# Patient Record
Sex: Male | Born: 1950 | Race: Black or African American | Hispanic: No | Marital: Married | Smoking: Current every day smoker
Health system: Southern US, Community
[De-identification: ages and names within clinical notes are randomized; demographics above are authoritative.]

## PROBLEM LIST (undated history)

## (undated) DIAGNOSIS — I10 Essential (primary) hypertension: Secondary | ICD-10-CM

## (undated) DIAGNOSIS — H409 Unspecified glaucoma: Secondary | ICD-10-CM

## (undated) HISTORY — DX: Essential (primary) hypertension: I10

---

## 1999-04-14 ENCOUNTER — Encounter: Payer: Self-pay | Admitting: Emergency Medicine

## 1999-04-14 ENCOUNTER — Emergency Department (HOSPITAL_COMMUNITY): Admission: EM | Admit: 1999-04-14 | Discharge: 1999-04-14 | Payer: Self-pay | Admitting: Emergency Medicine

## 2002-07-28 ENCOUNTER — Emergency Department (HOSPITAL_COMMUNITY): Admission: EM | Admit: 2002-07-28 | Discharge: 2002-07-28 | Payer: Self-pay | Admitting: Emergency Medicine

## 2003-10-21 ENCOUNTER — Emergency Department (HOSPITAL_COMMUNITY): Admission: EM | Admit: 2003-10-21 | Discharge: 2003-10-21 | Payer: Self-pay | Admitting: Emergency Medicine

## 2011-01-24 ENCOUNTER — Emergency Department (HOSPITAL_COMMUNITY)
Admission: EM | Admit: 2011-01-24 | Discharge: 2011-01-24 | Disposition: A | Discharge status: Home / Self Care | Payer: BC Managed Care – PPO | Attending: Emergency Medicine | Admitting: Emergency Medicine

## 2011-01-24 ENCOUNTER — Encounter (HOSPITAL_COMMUNITY): Payer: Self-pay | Admitting: *Deleted

## 2011-01-24 DIAGNOSIS — S46919A Strain of unspecified muscle, fascia and tendon at shoulder and upper arm level, unspecified arm, initial encounter: Secondary | ICD-10-CM

## 2011-01-24 DIAGNOSIS — F17200 Nicotine dependence, unspecified, uncomplicated: Secondary | ICD-10-CM | POA: Insufficient documentation

## 2011-01-24 DIAGNOSIS — M545 Low back pain, unspecified: Secondary | ICD-10-CM | POA: Insufficient documentation

## 2011-01-24 DIAGNOSIS — M25519 Pain in unspecified shoulder: Secondary | ICD-10-CM | POA: Insufficient documentation

## 2011-01-24 DIAGNOSIS — IMO0002 Reserved for concepts with insufficient information to code with codable children: Secondary | ICD-10-CM | POA: Insufficient documentation

## 2011-01-24 DIAGNOSIS — S335XXA Sprain of ligaments of lumbar spine, initial encounter: Secondary | ICD-10-CM | POA: Insufficient documentation

## 2011-01-24 DIAGNOSIS — S39012A Strain of muscle, fascia and tendon of lower back, initial encounter: Secondary | ICD-10-CM

## 2011-01-24 MED ORDER — CYCLOBENZAPRINE HCL 10 MG PO TABS: 10.0000 mg | ORAL_TABLET | Freq: Two times a day (BID) | ORAL | Status: AC | PRN

## 2011-01-24 MED ORDER — HYDROCODONE-ACETAMINOPHEN 5-500 MG PO TABS: 1.0000 | ORAL_TABLET | Freq: Four times a day (QID) | ORAL | Status: AC | PRN
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
## 2013-08-31 ENCOUNTER — Inpatient Hospital Stay (HOSPITAL_COMMUNITY)
Admission: EM | Admit: 2013-08-31 | Discharge: 2013-09-02 | DRG: 238 | Disposition: A | Discharge status: Home / Self Care | Payer: BC Managed Care – PPO | Attending: Surgery | Admitting: Surgery

## 2013-08-31 ENCOUNTER — Encounter (HOSPITAL_COMMUNITY): Payer: Self-pay | Admitting: Emergency Medicine

## 2013-08-31 DIAGNOSIS — I10 Essential (primary) hypertension: Secondary | ICD-10-CM | POA: Diagnosis present

## 2013-08-31 DIAGNOSIS — F17200 Nicotine dependence, unspecified, uncomplicated: Secondary | ICD-10-CM | POA: Diagnosis present

## 2013-08-31 DIAGNOSIS — I498 Other specified cardiac arrhythmias: Secondary | ICD-10-CM | POA: Diagnosis present

## 2013-08-31 DIAGNOSIS — R109 Unspecified abdominal pain: Secondary | ICD-10-CM | POA: Diagnosis present

## 2013-08-31 DIAGNOSIS — I714 Abdominal aortic aneurysm, without rupture, unspecified: Secondary | ICD-10-CM | POA: Diagnosis not present

## 2013-08-31 DIAGNOSIS — H409 Unspecified glaucoma: Secondary | ICD-10-CM | POA: Diagnosis present

## 2013-08-31 DIAGNOSIS — I739 Peripheral vascular disease, unspecified: Secondary | ICD-10-CM | POA: Diagnosis present

## 2013-08-31 HISTORY — DX: Unspecified glaucoma: H40.9

## 2013-08-31 LAB — COMPREHENSIVE METABOLIC PANEL
ALK PHOS: 65 U/L (ref 39–117)
ALT: 18 U/L (ref 0–53)
AST: 16 U/L (ref 0–37)
Albumin: 4.1 g/dL (ref 3.5–5.2)
Anion gap: 14 (ref 5–15)
BUN: 17 mg/dL (ref 6–23)
CHLORIDE: 102 meq/L (ref 96–112)
CO2: 25 mEq/L (ref 19–32)
Calcium: 10.5 mg/dL (ref 8.4–10.5)
Creatinine, Ser: 0.78 mg/dL (ref 0.50–1.35)
GFR calc non Af Amer: >90 mL/min (ref >90)
GLUCOSE: 109 mg/dL — AB (ref 70–99)
Potassium: 3.8 mEq/L (ref 3.7–5.3)
Sodium: 141 mEq/L (ref 137–147)
TOTAL PROTEIN: 8 g/dL (ref 6.0–8.3)
Total Bilirubin: 0.3 mg/dL (ref 0.3–1.2)

## 2013-08-31 LAB — CBC WITH DIFFERENTIAL/PLATELET
Basophils Absolute: 0 10*3/uL (ref 0.0–0.1)
Basophils Relative: 0 % (ref 0–1)
EOS ABS: 0 10*3/uL (ref 0.0–0.7)
Eosinophils Relative: 0 % (ref 0–5)
HCT: 42.8 % (ref 39.0–52.0)
HEMOGLOBIN: 14.7 g/dL (ref 13.0–17.0)
LYMPHS PCT: 14 % (ref 12–46)
Lymphs Abs: 1.3 10*3/uL (ref 0.7–4.0)
MCH: 30.4 pg (ref 26.0–34.0)
MCHC: 34.3 g/dL (ref 30.0–36.0)
MCV: 88.4 fL (ref 78.0–100.0)
MONOS PCT: 5 % (ref 3–12)
Monocytes Absolute: 0.5 10*3/uL (ref 0.1–1.0)
NEUTROS PCT: 81 % — AB (ref 43–77)
Neutro Abs: 7.4 10*3/uL (ref 1.7–7.7)
Platelets: 192 10*3/uL (ref 150–400)
RBC: 4.84 MIL/uL (ref 4.22–5.81)
RDW: 15.5 % (ref 11.5–15.5)
WBC: 9.2 10*3/uL (ref 4.0–10.5)

## 2013-08-31 LAB — I-STAT CHEM 8, ED
BUN: 18 mg/dL (ref 6–23)
CALCIUM ION: 1.23 mmol/L (ref 1.13–1.30)
CHLORIDE: 106 meq/L (ref 96–112)
Creatinine, Ser: 0.8 mg/dL (ref 0.50–1.35)
GLUCOSE: 115 mg/dL — AB (ref 70–99)
HEMATOCRIT: 48 % (ref 39.0–52.0)
Hemoglobin: 16.3 g/dL (ref 13.0–17.0)
POTASSIUM: 3.7 meq/L (ref 3.7–5.3)
Sodium: 140 mEq/L (ref 137–147)
TCO2: 27 mmol/L (ref 0–100)

## 2013-08-31 LAB — I-STAT TROPONIN, ED: Troponin i, poc: 0 ng/mL (ref 0.00–0.08)

## 2013-08-31 LAB — LIPASE, BLOOD: Lipase: 21 U/L (ref 11–59)

## 2013-08-31 MED ORDER — IOHEXOL 300 MG/ML  SOLN
25.0000 mL | Freq: Once | INTRAMUSCULAR | Status: AC | PRN
  Administered 2013-08-31: 25 mL via ORAL

## 2013-08-31 MED ORDER — MORPHINE SULFATE 4 MG/ML IJ SOLN
4.0000 mg | Freq: Once | INTRAMUSCULAR | Status: AC
  Administered 2013-08-31: 4 mg via INTRAVENOUS
  Filled 2013-08-31: qty 1

## 2013-08-31 MED ORDER — ONDANSETRON HCL 4 MG/2ML IJ SOLN
4.0000 mg | Freq: Once | INTRAMUSCULAR | Status: AC
  Administered 2013-08-31: 4 mg via INTRAVENOUS
  Filled 2013-08-31: qty 2

## 2013-08-31 MED ORDER — SODIUM CHLORIDE 0.9 % IV SOLN
1.0000 g | Freq: Once | INTRAVENOUS | Status: AC
  Administered 2013-08-31: 1 g via INTRAVENOUS
  Filled 2013-08-31: qty 10

## 2013-08-31 NOTE — ED Notes (Signed)
Pt placed into gown and on monitor upon arrival to room. Pt monitored by blood pressure, pulse ox, and 12 lead. Pts wife at bedside.

## 2013-08-31 NOTE — ED Provider Notes (Signed)
CSN: [id number]     Arrival date & time 08/31/13  2118 History   First MD Initiated Contact with Patient 08/31/13 2150     Chief Complaint  Patient presents with  . Abdominal Pain  . Bradycardia     (Consider location/radiation/quality/duration/timing/severity/associated sxs/prior Treatment) Patient is a 63 y.o. male presenting with abdominal pain. The history is provided by the patient and the spouse.  Abdominal Pain Pain location:  LLQ Pain quality: cramping, sharp and shooting   Pain radiates to:  Does not radiate Pain severity:  Moderate Onset quality:  Sudden Duration:  1 day Timing:  Constant Progression:  Unchanged Chronicity:  New Relieved by:  Nothing Worsened by:  Nothing tried Ineffective treatments:  None tried Associated symptoms: no chest pain, no chills, no diarrhea, no fever, no shortness of breath and no vomiting   Risk factors: being elderly    63 yo M with a chief complaint of left lower quadrant pain. This started this morning. Pain is crampy nonradiating. Patient states that it comes and goes. Patient has never had this pain before. Patient denies fevers chills. Patient denies any chest pain shortness breath.  Past Medical History  Diagnosis Date  . Glaucoma    History reviewed. No pertinent past surgical history. History reviewed. No pertinent family history. History  Substance Use Topics  . Smoking status: Current Every Day Smoker -- 0.50 packs/day    Types: Cigarettes  . Smokeless tobacco: Not on file  . Alcohol Use: Yes     Comment: drinks 3-4 drinks of liquor per day    Review of Systems  Constitutional: Negative for fever and chills.  HENT: Negative for congestion and facial swelling.   Eyes: Negative for discharge and visual disturbance.  Respiratory: Negative for shortness of breath.   Cardiovascular: Negative for chest pain and palpitations.  Gastrointestinal: Positive for abdominal pain. Negative for vomiting and diarrhea.   Musculoskeletal: Negative for arthralgias and myalgias.  Skin: Negative for color change and rash.  Neurological: Negative for tremors, syncope and headaches.  Psychiatric/Behavioral: Negative for confusion and dysphoric mood.      Allergies  Review of patient's allergies indicates no known allergies.  Home Medications   Prior to Admission medications   Medication Sig Start Date End Date Taking? Authorizing Provider  acetaminophen (TYLENOL) 500 MG tablet Take 500 mg by mouth daily as needed for mild pain.    Yes Historical Provider, MD  latanoprost (XALATAN) 0.005 % ophthalmic solution Place 1 drop into both eyes at bedtime.    Yes Historical Provider, MD  sodium-potassium bicarbonate (ALKA-SELTZER GOLD) TBEF dissolvable tablet Take 2 tablets by mouth daily as needed (for indigestion).   Yes Historical Provider, MD   BP 145/74  Pulse 46  Temp(Src) 98.3 F (36.8 C) (Oral)  Resp 26  Ht 6\' 3"  (1.905 m)  Wt 161 lb (73.029 kg)  BMI 20.12 kg/m2  SpO2 97% Physical Exam  Constitutional: He is oriented to person, place, and time. He appears well-developed and well-nourished.  HENT:  Head: Normocephalic and atraumatic.  Eyes: EOM are normal. Pupils are equal, round, and reactive to light.  Neck: Normal range of motion. Neck supple. No JVD present.  Cardiovascular: Normal rate and regular rhythm.  Exam reveals no gallop and no friction rub.   No murmur heard. Pulmonary/Chest: No respiratory distress. He has no wheezes.  Abdominal: He exhibits no distension. There is tenderness (mild TTP in the LLQ). There is no rebound and no guarding.  Musculoskeletal: Normal  range of motion.  Neurological: He is alert and oriented to person, place, and time.  Skin: No rash noted. No pallor.  Psychiatric: He has a normal mood and affect. His behavior is normal.    ED Course  Procedures (including critical care time) Labs Review Labs Reviewed  CBC WITH DIFFERENTIAL - Abnormal; Notable for the  following:    Neutrophils Relative % 81 (*)    All other components within normal limits  COMPREHENSIVE METABOLIC PANEL - Abnormal; Notable for the following:    Glucose, Bld 109 (*)    All other components within normal limits  I-STAT CHEM 8, ED - Abnormal; Notable for the following:    Glucose, Bld 115 (*)    All other components within normal limits  LIPASE, BLOOD  URINALYSIS, ROUTINE W REFLEX MICROSCOPIC  I-STAT TROPOININ, ED    Imaging Review No results found.   EKG Interpretation   Date/Time:  Wednesday August 31 2013 21:50:27 EDT Ventricular Rate:  42 PR Interval:  142 QRS Duration: 84 QT Interval:  478 QTC Calculation: 399 R Axis:   79 Text Interpretation:  Marked sinus bradycardia Left ventricular  hypertrophy Cannot rule out Septal infarct , age undetermined Abnormal ECG  Peaked t waves No old tracing to compare Confirmed by KNAPP  MD-J, JON  ([id number]) on 08/31/2013 9:56:05 PM      MDM   Final diagnoses:  Abdominal pain, unspecified abdominal location    63 yo M with a chief complaint of left lower quadrant pain. Patient's initial EKG concerning for hyperkalemia versus right heart pathology as patient was in sinus bradycardia with a rate of 42. I-STAT chem 8 negative for hyperkalemia. I-STAT troponin negative.  Patient care turned over to Dr. Lita Mains, awaiting CT abdomen/pelvis.    Deno Etienne, MD 09/01/13 0020

## 2013-08-31 NOTE — ED Notes (Signed)
Pt continues to be monitored by blood pressure, pulse ox, and 12 lead. Family remains at bedside. Provided pt with urinal and asked pt to provide urine specimen.

## 2013-08-31 NOTE — ED Notes (Signed)
Pt reports lower abd pain starting this morning, a/w n/v

## 2013-09-01 ENCOUNTER — Encounter (HOSPITAL_COMMUNITY): Payer: Self-pay

## 2013-09-01 ENCOUNTER — Emergency Department (HOSPITAL_COMMUNITY): Payer: BC Managed Care – PPO

## 2013-09-01 ENCOUNTER — Inpatient Hospital Stay (HOSPITAL_COMMUNITY): Payer: BC Managed Care – PPO

## 2013-09-01 ENCOUNTER — Encounter (HOSPITAL_COMMUNITY)
Admission: EM | Disposition: A | Discharge status: Home / Self Care | Payer: Self-pay | Source: Home / Self Care | Attending: Surgery

## 2013-09-01 ENCOUNTER — Inpatient Hospital Stay (HOSPITAL_COMMUNITY): Payer: BC Managed Care – PPO | Admitting: Certified Registered Nurse Anesthetist

## 2013-09-01 ENCOUNTER — Encounter (HOSPITAL_COMMUNITY): Payer: BC Managed Care – PPO | Admitting: Certified Registered Nurse Anesthetist

## 2013-09-01 DIAGNOSIS — I498 Other specified cardiac arrhythmias: Secondary | ICD-10-CM | POA: Diagnosis present

## 2013-09-01 DIAGNOSIS — I714 Abdominal aortic aneurysm, without rupture, unspecified: Secondary | ICD-10-CM | POA: Diagnosis present

## 2013-09-01 DIAGNOSIS — R109 Unspecified abdominal pain: Secondary | ICD-10-CM | POA: Diagnosis present

## 2013-09-01 DIAGNOSIS — I10 Essential (primary) hypertension: Secondary | ICD-10-CM | POA: Diagnosis present

## 2013-09-01 DIAGNOSIS — F17200 Nicotine dependence, unspecified, uncomplicated: Secondary | ICD-10-CM | POA: Diagnosis present

## 2013-09-01 DIAGNOSIS — I739 Peripheral vascular disease, unspecified: Secondary | ICD-10-CM | POA: Diagnosis present

## 2013-09-01 DIAGNOSIS — H409 Unspecified glaucoma: Secondary | ICD-10-CM | POA: Diagnosis present

## 2013-09-01 HISTORY — PX: ABDOMINAL AORTIC ENDOVASCULAR STENT GRAFT: SHX5707

## 2013-09-01 LAB — URINALYSIS, ROUTINE W REFLEX MICROSCOPIC
BILIRUBIN URINE: NEGATIVE
GLUCOSE, UA: NEGATIVE mg/dL
KETONES UR: 40 mg/dL — AB
Leukocytes, UA: NEGATIVE
NITRITE: NEGATIVE
PH: 7 (ref 5.0–8.0)
Protein, ur: NEGATIVE mg/dL
SPECIFIC GRAVITY, URINE: 1.01 (ref 1.005–1.030)
Urobilinogen, UA: 1 mg/dL (ref 0.0–1.0)

## 2013-09-01 LAB — URINE MICROSCOPIC-ADD ON

## 2013-09-01 LAB — SURGICAL PCR SCREEN
MRSA, PCR: NEGATIVE
STAPHYLOCOCCUS AUREUS: NEGATIVE

## 2013-09-01 LAB — ABO/RH: ABO/RH(D): A POS

## 2013-09-01 LAB — PREPARE RBC (CROSSMATCH)

## 2013-09-01 SURGERY — INSERTION, ENDOVASCULAR STENT GRAFT, AORTA, ABDOMINAL
Anesthesia: General

## 2013-09-01 MED ORDER — DEXTROSE 5 % IV SOLN
1.5000 g | INTRAVENOUS | Status: AC
  Administered 2013-09-01: 1.5 g via INTRAVENOUS
  Filled 2013-09-01: qty 1.5

## 2013-09-01 MED ORDER — MORPHINE SULFATE 4 MG/ML IJ SOLN
INTRAMUSCULAR | Status: AC
  Filled 2013-09-01: qty 1

## 2013-09-01 MED ORDER — LABETALOL HCL 5 MG/ML IV SOLN: 10.0000 mg | INTRAVENOUS | Status: DC | PRN

## 2013-09-01 MED ORDER — LACTATED RINGERS IV SOLN
INTRAVENOUS | Status: DC | PRN
  Administered 2013-09-01: 12:00:00 via INTRAVENOUS

## 2013-09-01 MED ORDER — DOCUSATE SODIUM 100 MG PO CAPS: 100.0000 mg | ORAL_CAPSULE | Freq: Every day | ORAL | Status: DC

## 2013-09-01 MED ORDER — ONDANSETRON HCL 4 MG/2ML IJ SOLN: 4.0000 mg | Freq: Four times a day (QID) | INTRAMUSCULAR | Status: DC | PRN

## 2013-09-01 MED ORDER — DOPAMINE-DEXTROSE 3.2-5 MG/ML-% IV SOLN: 3.0000 ug/kg/min | INTRAVENOUS | Status: DC

## 2013-09-01 MED ORDER — DEXTROSE 5 % IV SOLN
1.5000 g | Freq: Two times a day (BID) | INTRAVENOUS | Status: AC
  Administered 2013-09-01 – 2013-09-02 (×2): 1.5 g via INTRAVENOUS
  Filled 2013-09-01 (×2): qty 1.5

## 2013-09-01 MED ORDER — LATANOPROST 0.005 % OP SOLN
1.0000 [drp] | Freq: Every day | OPHTHALMIC | Status: DC
  Administered 2013-09-01: 1 [drp] via OPHTHALMIC
  Filled 2013-09-01: qty 2.5

## 2013-09-01 MED ORDER — HEPARIN SODIUM (PORCINE) 1000 UNIT/ML IJ SOLN
INTRAMUSCULAR | Status: DC | PRN
  Administered 2013-09-01: 7000 [IU] via INTRAVENOUS

## 2013-09-01 MED ORDER — ACETAMINOPHEN 325 MG PO TABS: 325.0000 mg | ORAL_TABLET | ORAL | Status: DC | PRN

## 2013-09-01 MED ORDER — GUAIFENESIN-DM 100-10 MG/5ML PO SYRP: 15.0000 mL | ORAL_SOLUTION | ORAL | Status: DC | PRN

## 2013-09-01 MED ORDER — SODIUM CHLORIDE 0.9 % IV SOLN: 500.0000 mL | Freq: Once | INTRAVENOUS | Status: AC | PRN

## 2013-09-01 MED ORDER — PHENYLEPHRINE HCL 10 MG/ML IJ SOLN
10.0000 mg | INTRAMUSCULAR | Status: DC | PRN
  Administered 2013-09-01: 15 ug/min via INTRAVENOUS

## 2013-09-01 MED ORDER — BISACODYL 10 MG RE SUPP: 10.0000 mg | Freq: Every day | RECTAL | Status: DC | PRN

## 2013-09-01 MED ORDER — ACETAMINOPHEN 650 MG RE SUPP: 325.0000 mg | RECTAL | Status: DC | PRN

## 2013-09-01 MED ORDER — FENTANYL CITRATE 0.05 MG/ML IJ SOLN
INTRAMUSCULAR | Status: DC | PRN
Start: 2013-09-01 — End: 2013-09-01
  Administered 2013-09-01: 100 ug via INTRAVENOUS

## 2013-09-01 MED ORDER — LACTATED RINGERS IV SOLN
INTRAVENOUS | Status: DC
  Administered 2013-09-01: 11:00:00 via INTRAVENOUS

## 2013-09-01 MED ORDER — SENNOSIDES-DOCUSATE SODIUM 8.6-50 MG PO TABS
1.0000 | ORAL_TABLET | Freq: Every evening | ORAL | Status: DC | PRN
  Filled 2013-09-01: qty 1

## 2013-09-01 MED ORDER — FENTANYL CITRATE 0.05 MG/ML IJ SOLN
INTRAMUSCULAR | Status: AC
  Filled 2013-09-01: qty 5

## 2013-09-01 MED ORDER — ONDANSETRON HCL 4 MG/2ML IJ SOLN: 4.0000 mg | Freq: Once | INTRAMUSCULAR | Status: DC | PRN

## 2013-09-01 MED ORDER — METOPROLOL TARTRATE 1 MG/ML IV SOLN: 2.0000 mg | INTRAVENOUS | Status: DC | PRN

## 2013-09-01 MED ORDER — ALUM & MAG HYDROXIDE-SIMETH 200-200-20 MG/5ML PO SUSP: 15.0000 mL | ORAL | Status: DC | PRN

## 2013-09-01 MED ORDER — SODIUM CHLORIDE 0.9 % IV SOLN: Freq: Once | INTRAVENOUS | Status: DC

## 2013-09-01 MED ORDER — LABETALOL HCL 5 MG/ML IV SOLN
10.0000 mg | INTRAVENOUS | Status: DC | PRN
Start: 2013-09-01 — End: 2013-09-01

## 2013-09-01 MED ORDER — KCL IN DEXTROSE-NACL 20-5-0.45 MEQ/L-%-% IV SOLN
INTRAVENOUS | Status: DC
  Administered 2013-09-01: 75 mL/h via INTRAVENOUS
  Filled 2013-09-01 (×2): qty 1000

## 2013-09-01 MED ORDER — MIDAZOLAM HCL 2 MG/2ML IJ SOLN
INTRAMUSCULAR | Status: AC
  Filled 2013-09-01: qty 2

## 2013-09-01 MED ORDER — POTASSIUM CHLORIDE CRYS ER 20 MEQ PO TBCR: 20.0000 meq | EXTENDED_RELEASE_TABLET | Freq: Every day | ORAL | Status: DC | PRN

## 2013-09-01 MED ORDER — NEOSTIGMINE METHYLSULFATE 10 MG/10ML IV SOLN
INTRAVENOUS | Status: DC | PRN
  Administered 2013-09-01: 4 mg via INTRAVENOUS

## 2013-09-01 MED ORDER — MAGNESIUM SULFATE 40 MG/ML IJ SOLN: 2.0000 g | Freq: Every day | INTRAMUSCULAR | Status: DC | PRN

## 2013-09-01 MED ORDER — SODIUM CHLORIDE 0.9 % IV SOLN
INTRAVENOUS | Status: DC
  Administered 2013-09-01: 15:00:00 via INTRAVENOUS

## 2013-09-01 MED ORDER — PHENOL 1.4 % MT LIQD
1.0000 | OROMUCOSAL | Status: DC | PRN
Start: 2013-09-01 — End: 2013-09-01

## 2013-09-01 MED ORDER — MORPHINE SULFATE 4 MG/ML IJ SOLN
4.0000 mg | Freq: Once | INTRAMUSCULAR | Status: AC
  Administered 2013-09-01: 4 mg via INTRAVENOUS

## 2013-09-01 MED ORDER — IODIXANOL 320 MG/ML IV SOLN
INTRAVENOUS | Status: DC | PRN
  Administered 2013-09-01: 40.28 mL via INTRA_ARTERIAL

## 2013-09-01 MED ORDER — PHENYLEPHRINE HCL 10 MG/ML IJ SOLN
INTRAMUSCULAR | Status: AC
  Filled 2013-09-01: qty 1

## 2013-09-01 MED ORDER — HYDRALAZINE HCL 20 MG/ML IJ SOLN: 10.0000 mg | INTRAMUSCULAR | Status: DC | PRN

## 2013-09-01 MED ORDER — ROCURONIUM BROMIDE 100 MG/10ML IV SOLN
INTRAVENOUS | Status: DC | PRN
  Administered 2013-09-01: 10 mg via INTRAVENOUS
  Administered 2013-09-01: 40 mg via INTRAVENOUS

## 2013-09-01 MED ORDER — ONDANSETRON HCL 4 MG/2ML IJ SOLN
INTRAMUSCULAR | Status: DC | PRN
  Administered 2013-09-01 (×2): 4 mg via INTRAVENOUS

## 2013-09-01 MED ORDER — FENTANYL CITRATE 0.05 MG/ML IJ SOLN
INTRAMUSCULAR | Status: AC
  Filled 2013-09-01: qty 2

## 2013-09-01 MED ORDER — HYDROMORPHONE HCL PF 1 MG/ML IJ SOLN: 0.2500 mg | INTRAMUSCULAR | Status: DC | PRN

## 2013-09-01 MED ORDER — OXYCODONE-ACETAMINOPHEN 5-325 MG PO TABS
1.0000 | ORAL_TABLET | ORAL | Status: DC | PRN
  Administered 2013-09-01: 1 via ORAL
  Administered 2013-09-02 (×2): 2 via ORAL
  Filled 2013-09-01 (×2): qty 2
  Filled 2013-09-01: qty 1

## 2013-09-01 MED ORDER — KCL IN DEXTROSE-NACL 20-5-0.45 MEQ/L-%-% IV SOLN
INTRAVENOUS | Status: AC
  Administered 2013-09-01: 1000 mL
  Filled 2013-09-01: qty 1000

## 2013-09-01 MED ORDER — PROTAMINE SULFATE 10 MG/ML IV SOLN
INTRAVENOUS | Status: DC | PRN
  Administered 2013-09-01: 40 mg via INTRAVENOUS
  Administered 2013-09-01: 10 mg via INTRAVENOUS

## 2013-09-01 MED ORDER — MORPHINE SULFATE 2 MG/ML IJ SOLN
2.0000 mg | INTRAMUSCULAR | Status: DC | PRN
  Administered 2013-09-01: 2 mg via INTRAVENOUS
  Filled 2013-09-01: qty 1

## 2013-09-01 MED ORDER — PHENOL 1.4 % MT LIQD: 1.0000 | OROMUCOSAL | Status: DC | PRN

## 2013-09-01 MED ORDER — MORPHINE SULFATE 2 MG/ML IJ SOLN: 2.0000 mg | INTRAMUSCULAR | Status: DC | PRN

## 2013-09-01 MED ORDER — HYDRALAZINE HCL 20 MG/ML IJ SOLN
10.0000 mg | INTRAMUSCULAR | Status: DC | PRN
  Administered 2013-09-01: 10 mg via INTRAVENOUS
  Filled 2013-09-01: qty 1

## 2013-09-01 MED ORDER — SODIUM CHLORIDE 0.9 % IR SOLN
Status: DC | PRN
  Administered 2013-09-01: 13:00:00

## 2013-09-01 MED ORDER — PROPOFOL 10 MG/ML IV BOLUS
INTRAVENOUS | Status: DC | PRN
  Administered 2013-09-01: 150 mg via INTRAVENOUS

## 2013-09-01 MED ORDER — 0.9 % SODIUM CHLORIDE (POUR BTL) OPTIME
TOPICAL | Status: DC | PRN
  Administered 2013-09-01: 1000 mL

## 2013-09-01 MED ORDER — LIDOCAINE HCL (CARDIAC) 20 MG/ML IV SOLN
INTRAVENOUS | Status: DC | PRN
Start: 2013-09-01 — End: 2013-09-01
  Administered 2013-09-01: 100 mg via INTRAVENOUS

## 2013-09-01 MED ORDER — PANTOPRAZOLE SODIUM 40 MG PO TBEC: 40.0000 mg | DELAYED_RELEASE_TABLET | Freq: Every day | ORAL | Status: DC

## 2013-09-01 MED ORDER — PROPOFOL 10 MG/ML IV BOLUS
INTRAVENOUS | Status: AC
  Filled 2013-09-01: qty 20

## 2013-09-01 MED ORDER — GLYCOPYRROLATE 0.2 MG/ML IJ SOLN
INTRAMUSCULAR | Status: DC | PRN
  Administered 2013-09-01: .8 mg via INTRAVENOUS
  Administered 2013-09-01: 0.4 mg via INTRAVENOUS

## 2013-09-01 MED ORDER — IOHEXOL 300 MG/ML  SOLN
100.0000 mL | Freq: Once | INTRAMUSCULAR | Status: AC | PRN
  Administered 2013-09-01: 100 mL via INTRAVENOUS

## 2013-09-01 MED ORDER — ENOXAPARIN SODIUM 30 MG/0.3ML ~~LOC~~ SOLN
30.0000 mg | SUBCUTANEOUS | Status: DC
  Administered 2013-09-02: 30 mg via SUBCUTANEOUS
  Filled 2013-09-01 (×2): qty 0.3

## 2013-09-01 SURGICAL SUPPLY — 77 items
ADH SKN CLS APL DERMABOND .7 (GAUZE/BANDAGES/DRESSINGS) ×2
BAG BANDED W/RUBBER/TAPE 36X54 (MISCELLANEOUS) ×3 IMPLANT
BAG EQP BAND 135X91 W/RBR TAPE (MISCELLANEOUS) ×1
BAG SNAP BAND KOVER 36X36 (MISCELLANEOUS) ×9 IMPLANT
BLADE 10 SAFETY STRL DISP (BLADE) ×3 IMPLANT
BLADE SURG CLIPPER 3M 9600 (MISCELLANEOUS) ×3 IMPLANT
CANISTER SUCTION 2500CC (MISCELLANEOUS) ×3 IMPLANT
CATH BEACON 5.038 65CM KMP-01 (CATHETERS) ×2 IMPLANT
CATH OMNI FLUSH .035X70CM (CATHETERS) ×2 IMPLANT
CLIP TI MEDIUM 24 (CLIP) IMPLANT
CLIP TI WIDE RED SMALL 24 (CLIP) IMPLANT
COVER DOME SNAP 22 D (MISCELLANEOUS) ×3 IMPLANT
COVER MAYO STAND STRL (DRAPES) ×3 IMPLANT
COVER PROBE W GEL 5X96 (DRAPES) ×3 IMPLANT
COVER SURGICAL LIGHT HANDLE (MISCELLANEOUS) ×3 IMPLANT
DERMABOND ADVANCED (GAUZE/BANDAGES/DRESSINGS) ×4
DERMABOND ADVANCED .7 DNX12 (GAUZE/BANDAGES/DRESSINGS) ×1 IMPLANT
DEVICE CLOSURE PERCLS PRGLD 6F (VASCULAR PRODUCTS) IMPLANT
DRAPE TABLE COVER HEAVY DUTY (DRAPES) ×3 IMPLANT
DRSG TEGADERM 2-3/8X2-3/4 SM (GAUZE/BANDAGES/DRESSINGS) ×6 IMPLANT
DRYSEAL FLEXSHEATH 12FR 33CM (SHEATH) ×2
DRYSEAL FLEXSHEATH 18FR 33CM (SHEATH) ×2
ELECT REM PT RETURN 9FT ADLT (ELECTROSURGICAL) ×6
ELECTRODE REM PT RTRN 9FT ADLT (ELECTROSURGICAL) ×2 IMPLANT
EXCLUDER TNK 28X14.5MMX12CM (Endovascular Graft) IMPLANT
EXCLUDER TRUNK 28X14.5MMX12CM (Endovascular Graft) ×3 IMPLANT
GAUZE SPONGE 2X2 8PLY STRL LF (GAUZE/BANDAGES/DRESSINGS) ×2 IMPLANT
GLOVE BIOGEL PI IND STRL 7.5 (GLOVE) ×1 IMPLANT
GLOVE BIOGEL PI INDICATOR 7.5 (GLOVE) ×2
GLOVE SURG SS PI 7.5 STRL IVOR (GLOVE) ×3 IMPLANT
GOWN STRL REUS W/ TWL LRG LVL3 (GOWN DISPOSABLE) ×2 IMPLANT
GOWN STRL REUS W/ TWL XL LVL3 (GOWN DISPOSABLE) ×1 IMPLANT
GOWN STRL REUS W/TWL LRG LVL3 (GOWN DISPOSABLE) ×6
GOWN STRL REUS W/TWL XL LVL3 (GOWN DISPOSABLE) ×3
GRAFT BALLN CATH 65CM (STENTS) IMPLANT
HEMOSTAT SNOW SURGICEL 2X4 (HEMOSTASIS) IMPLANT
KIT BASIN OR (CUSTOM PROCEDURE TRAY) ×3 IMPLANT
KIT ROOM TURNOVER OR (KITS) ×3 IMPLANT
LEG CONTRALATERAL 16X20X13.5 (Vascular Products) ×3 IMPLANT
LEG CONTRALATERAL 16X20X9.5 (Endovascular Graft) ×3 IMPLANT
NDL PERC 18GX7CM (NEEDLE) ×1 IMPLANT
NEEDLE PERC 18GX7CM (NEEDLE) ×3 IMPLANT
NS IRRIG 1000ML POUR BTL (IV SOLUTION) ×3 IMPLANT
PACK AORTA (CUSTOM PROCEDURE TRAY) ×3 IMPLANT
PAD ARMBOARD 7.5X6 YLW CONV (MISCELLANEOUS) ×6 IMPLANT
PENCIL BUTTON HOLSTER BLD 10FT (ELECTRODE) ×2 IMPLANT
PERCLOSE PROGLIDE 6F (VASCULAR PRODUCTS) ×12
PROTECTION STATION PRESSURIZED (MISCELLANEOUS) ×3
SHEATH AVANTI 11CM 8FR (MISCELLANEOUS) ×2 IMPLANT
SHEATH BRITE TIP 8FR 23CM (MISCELLANEOUS) ×2 IMPLANT
SHEATH DRYSEAL FLEX 12FR 33CM (SHEATH) IMPLANT
SHEATH DRYSEAL FLEX 18FR 33CM (SHEATH) IMPLANT
SHIELD RADPAD SCOOP 12X17 (MISCELLANEOUS) ×4 IMPLANT
SPONGE GAUZE 2X2 STER 10/PKG (GAUZE/BANDAGES/DRESSINGS) ×4
STATION PROTECTION PRESSURIZED (MISCELLANEOUS) IMPLANT
STENT GRAFT BALLN CATH 65CM (STENTS) ×2
STENT GRAFT CONTRALAT 16X20X9. (Endovascular Graft) IMPLANT
STENT GRAFT CONTRALAT 20X13.5 (Vascular Products) IMPLANT
STOPCOCK MORSE 400PSI 3WAY (MISCELLANEOUS) ×3 IMPLANT
SUT ETHILON 3 0 PS 1 (SUTURE) IMPLANT
SUT PROLENE 5 0 C 1 24 (SUTURE) IMPLANT
SUT VIC AB 2-0 CT1 27 (SUTURE)
SUT VIC AB 2-0 CT1 TAPERPNT 27 (SUTURE) IMPLANT
SUT VIC AB 3-0 SH 27 (SUTURE)
SUT VIC AB 3-0 SH 27X BRD (SUTURE) IMPLANT
SUT VICRYL 4-0 PS2 18IN ABS (SUTURE) ×6 IMPLANT
SYR 20CC LL (SYRINGE) ×6 IMPLANT
SYR 30ML LL (SYRINGE) ×2 IMPLANT
SYR 5ML LL (SYRINGE) ×4 IMPLANT
SYR MEDRAD MARK V 150ML (SYRINGE) ×3 IMPLANT
SYRINGE 10CC LL (SYRINGE) ×9 IMPLANT
TOWEL OR 17X24 6PK STRL BLUE (TOWEL DISPOSABLE) ×6 IMPLANT
TOWEL OR 17X26 10 PK STRL BLUE (TOWEL DISPOSABLE) ×6 IMPLANT
TRAY FOLEY CATH 16FRSI W/METER (SET/KITS/TRAYS/PACK) ×3 IMPLANT
TUBING HIGH PRESSURE 120CM (CONNECTOR) ×3 IMPLANT
WIRE AMPLATZ SS-J .035X180CM (WIRE) ×4 IMPLANT
WIRE BENTSON .035X145CM (WIRE) ×4 IMPLANT

## 2013-09-01 NOTE — ED Notes (Signed)
Vascular MD at bedside.

## 2013-09-01 NOTE — Anesthesia Preprocedure Evaluation (Signed)
Anesthesia Evaluation  Patient identified by MRN, date of birth, ID band Patient awake    Reviewed: Allergy & Precautions, H&P , NPO status , Patient's Chart, lab work & pertinent test results  Airway       Dental   Pulmonary Current Smoker,          Cardiovascular hypertension, + Peripheral Vascular Disease     Neuro/Psych    GI/Hepatic   Endo/Other    Renal/GU      Musculoskeletal   Abdominal   Peds  Hematology   Anesthesia Other Findings   Reproductive/Obstetrics                           Anesthesia Physical Anesthesia Plan  ASA: II  Anesthesia Plan: General   Post-op Pain Management:    Induction: Intravenous  Airway Management Planned: Oral ETT  Additional Equipment: Arterial line and CVP  Intra-op Plan:   Post-operative Plan: Extubation in OR  Informed Consent: I have reviewed the patients History and Physical, chart, labs and discussed the procedure including the risks, benefits and alternatives for the proposed anesthesia with the patient or authorized representative who has indicated his/her understanding and acceptance.     Plan Discussed with: CRNA, Anesthesiologist and Surgeon  Anesthesia Plan Comments:         Anesthesia Quick Evaluation

## 2013-09-01 NOTE — Progress Notes (Signed)
[facility]  Patient alert and oriented. Bilateral groins soft without hematoma.  Distal PT/DP pulses palpable.  Shyler Hamill MAUREEN PA-C

## 2013-09-01 NOTE — Transfer of Care (Signed)
Immediate Anesthesia Transfer of Care Note  Patient: Duane Delgado  Procedure(s) Performed: Procedure(s): ABDOMINAL AORTIC ENDOVASCULAR STENT GRAFT (N/A)  Patient Location: PACU  Anesthesia Type:General  Level of Consciousness: awake, alert  and oriented  Airway & Oxygen Therapy: Patient Spontanous Breathing and Patient connected to nasal cannula oxygen  Post-op Assessment: Report given to PACU RN, Post -op Vital signs reviewed and stable and Patient moving all extremities  Post vital signs: Reviewed and stable  Complications: No apparent anesthesia complications

## 2013-09-01 NOTE — Op Note (Addendum)
Patient name: Duane Delgado MRN: [id number] DOB: Jun 06, 1950 Sex: male  08/31/2013 - 09/01/2013 Pre-operative Diagnosis: Symptomatic abdominal aortic aneurysm Post-operative diagnosis:  Same Surgeon:  Eldridge Abrahams Assistants:  Gerri Lins Procedure:   #1: Bilateral ultrasound-guided common femoral artery access   #2: Endovascular repair of abdominal aortic aneurysm   #3: Distal extension x1   #4: Catheter in aorta x2   #5: Abdominal aortogram  Anesthesia:  Gen. Blood Loss:  See anesthesia record Specimens:  None  Findings:  Complete exclusion Devices used: Main body was a Gore Excluder primary right 28 x 14 x 12.  Ipsilateral extension is a Gore Excluder right 20 x 9.5.  Contralateral left is a Gore 20 x 13.5  Indications:  This is a 63 year old gentleman who presented to the emergency department with a one-day history of abdominal pain.  He had a CT scan which showed a 4.5 cm aneurysm.  The patient was very symptomatic to touch.  Options were discussed with the patient and we decided to proceed with endovascular repair.  Procedure:  The patient was identified in the holding area and taken to [place] 16  The patient was then placed supine on the table. general anesthesia was administered.  The patient was prepped and draped in the usual sterile fashion.  A time out was called and antibiotics were administered.  Ultrasound was used to evaluate bilateral common femoral arteries.  They were patent without significant calcification.  A #11 blade was used to make a skin nick bilaterally.  Bilateral common femoral arteries were then cannulated under ultrasound guidance with an 18-gauge needle.  An 8 [place] dilator was used to dilate the subcutaneous tract.  Pro-glide devices were deployed at the 11:00 and 1:00 position for pre-closure.  8 French sheaths were placed bilaterally.  The patient was fully heparinized.  An Amplatz superstiff wire was then placed up the right, followed by  an 18 [place] sheath.  An omni-flush catheter was advanced up the left side.  This was positioned at the level of L1.  The main body device was prepared on the back table.  This was a Dealer 28 x 14 x 12.  It was advanced up the right side.  An abdominal aortogram was performed which delineated the location of the renal arteries.  The main body was then deployed down to the contralateral gate.  The contralateral gate was then cannulated using an Omni flush catheter and a Careers adviser.  The catheter was able to be freely rotated within the main body, confirming successful cannulation.  Next an image was obtained in the right anterior oblique position.  An Amplatz superstiff wire was placed and the Omni flush catheter and a [place] sheath were removed.  The 12 French sheath was then inserted over the Amplatz wire into the contralateral gate.  I selected a Gore 20 x 13.5 device.  This was deployed, landing at the level of the left hypogastric artery.  Next, the remaining portion of the ipsilateral limb was deployed.  The mesh to tack it was rotated to a left anterior oblique position a retrograde image was obtained, locating the right hypogastric artery.  A Gore 20 x 9.5 device was then advanced through the sheath into appropriate position and then deployed landing at the level of the right hypogastric artery.  Next, a Q-50 balloon was used to mold the proximal and distal attachment sites as well as device overlap.  A completion arteriogram was  then performed which showed complete exclusion of the aneurysm with no evidence of a type I or 3 leak.  There is a very late type II leak.  Next, the stiff wires were exchanged out for soft wires.  All catheters were removed.  The groins were then closed by securing the previously deployed pro-glide devices.  50 mg of protamine was then administered.  Once hemostasis was satisfactory, the groin incisions were closed with 4-0 Vicryl.  The patient continued to have palpable  pedal pulses after the procedure.  There were no immediate complications.   Disposition:  To PACU in stable condition.   Theotis Burrow, M.D. [facility] Office: [phone] Pager:  [phone]

## 2013-09-01 NOTE — ED Provider Notes (Signed)
Patient presented to the emergency complaint of left lower quadrant abdominal pain. Pain was cramping and nonradiating.   On exam the patient was noted to be bradycardic.  He didn't appear in any distress. Heart was bradycardic no murmurs rubs or gallops Lungs clear to auscultation Abdomen and tenderness in the left lower quadrant without rebound or guarding  Patient's EKG was concerning for the possibility of hyperkalemia. He did have peaked T waves.  Patient's laboratory tests were unremarkable. His cardiac enzymes were normal. A CT scan of the abdomen and pelvis was ordered. The case was turned over to Dr. Lita Mains  to follow up on the CAT scan  I saw and evaluated the patient, reviewed the resident's note and I agree with the findings and plan.   EKG Interpretation   Date/Time:  Wednesday August 31 2013 21:50:27 EDT Ventricular Rate:  42 PR Interval:  142 QRS Duration: 84 QT Interval:  478 QTC Calculation: 399 R Axis:   79 Text Interpretation:  Marked sinus bradycardia Left ventricular  hypertrophy Cannot rule out Septal infarct , age undetermined Abnormal ECG  Peaked t waves No old tracing to compare Confirmed by Ebba Goll  MD-J, Shantelle Alles  [phone]) on 08/31/2013 9:56:05 PM        Dorie Rank, MD 09/01/13 2008

## 2013-09-01 NOTE — Anesthesia Postprocedure Evaluation (Signed)
  Anesthesia Post-op Note  Patient: Margie Billet  Procedure(s) Performed: Procedure(s): ABDOMINAL AORTIC ENDOVASCULAR STENT GRAFT (N/A)  Patient Location: PACU  Anesthesia Type:General  Level of Consciousness: awake, oriented, sedated and patient cooperative  Airway and Oxygen Therapy: Patient Spontanous Breathing  Post-op Pain: mild  Post-op Assessment: Post-op Vital signs reviewed, Patient's Cardiovascular Status Stable, Respiratory Function Stable, Patent Airway and No signs of Nausea or vomiting  Post-op Vital Signs: stable  Last Vitals:  Filed Vitals:   09/01/13 1000  BP:   Pulse: 48  Temp:   Resp: 31    Complications: No apparent anesthesia complications

## 2013-09-01 NOTE — Care Management Note (Signed)
    Page 1 of 1   09/01/2013     8:28:06 AM CARE MANAGEMENT NOTE 09/01/2013  Patient:  Duane Delgado, Duane Delgado   Account Number:  [id number]  Date Initiated:  09/01/2013  Documentation initiated by:  Elissa Hefty  Subjective/Objective Assessment:   adm w abd pain,aneurysm     Action/Plan:   lives w fam, pcp dr dean mitchell   Anticipated DC Date:     Anticipated DC Plan:           Choice offered to / List presented to:             Status of service:   Medicare Important Message given?   (If response is "NO", the following Medicare IM given date fields will be blank) Date Medicare IM given:   Medicare IM given by:   Date Additional Medicare IM given:   Additional Medicare IM given by:    Discharge Disposition:    Per UR Regulation:  Reviewed for med. necessity/level of care/duration of stay  If discussed at [place] of Stay Meetings, dates discussed:    Comments:

## 2013-09-01 NOTE — Anesthesia Procedure Notes (Signed)
Procedure Name: Intubation Date/Time: 09/01/2013 12:03 PM Performed by: Trixie Deis A Pre-anesthesia Checklist: Patient identified, Emergency Drugs available, Suction available, Patient being monitored and Timeout performed Patient Re-evaluated:Patient Re-evaluated prior to inductionOxygen Delivery Method: Circle system utilized Preoxygenation: Pre-oxygenation with 100% oxygen Intubation Type: IV induction Ventilation: Mask ventilation without difficulty Laryngoscope Size: Mac and 3 Grade View: Grade I Tube size: 7.5 mm Number of attempts: 1 Airway Equipment and Method: Stylet

## 2013-09-01 NOTE — H&P (Signed)
Patient name: Duane Delgado MRN: [id number] DOB: September 09, 1950 Sex: male   Referred by: EDP  Reason for referral:  Chief Complaint  Patient presents with  . Abdominal Pain  . Bradycardia    HISTORY OF PRESENT ILLNESS: Patient is a 63 year old gentleman who denies any prior similar symptoms. Yesterday morning he began having pain in his abdomen extending into his left lower quadrant and groin. Had never had any similar pain. This was associated with nausea and vomiting. He presented to the emergency room around 10 PM for further evaluation. Workup included a CT scan. I was called approximately 3 AM regarding finding of a 4.5 cm infrarenal abdominal aortic aneurysm with no evidence of leak. Of concern was that he had tenderness over this. Patient has no prior known history of aneurysm. Does not to seek medical care frequently. Has no history of cardiac disease. No history of hypertension. He is a long-term cigarette smoker and does have alcohol consumption daily.  Past Medical History  Diagnosis Date  . Glaucoma     History reviewed. No pertinent past surgical history.  History   Social History  . Marital Status: Single    Spouse Name: N/A    Number of Children: N/A  . Years of Education: N/A   Occupational History  . Not on file.   Social History Main Topics  . Smoking status: Current Every Day Smoker -- 0.50 packs/day    Types: Cigarettes  . Smokeless tobacco: Not on file  . Alcohol Use: Yes     Comment: drinks 3-4 drinks of liquor per day  . Drug Use: No  . Sexual Activity: Not on file   Other Topics Concern  . Not on file   Social History Narrative  . No narrative on file    History reviewed. No pertinent family history.  Allergies as of 08/31/2013  . (No Known Allergies)    No current facility-administered medications on file prior to encounter.   Current Outpatient Prescriptions on File Prior to Encounter  Medication Sig Dispense Refill  .  acetaminophen (TYLENOL) 500 MG tablet Take 500 mg by mouth daily as needed for mild pain.       Marland Kitchen latanoprost (XALATAN) 0.005 % ophthalmic solution Place 1 drop into both eyes at bedtime.          REVIEW OF SYSTEMS:  Positives indicated with an "X"  CARDIOVASCULAR:  [ ]  chest pain   [ ]  chest pressure   [ ]  palpitations   [ ]  orthopnea   [ ]  dyspnea on exertion   [ ]  claudication   [ ]  rest pain   [ ]  DVT   [ ]  phlebitis PULMONARY:   [ ]  productive cough   [ ]  asthma   [ ]  wheezing NEUROLOGIC:   [ ]  weakness  [ ]  paresthesias  [ ]  aphasia  [ ]  amaurosis  [ ]  dizziness HEMATOLOGIC:   [ ]  bleeding problems   [ ]  clotting disorders MUSCULOSKELETAL:  [ ]  joint pain   [ ]  joint swelling GASTROINTESTINAL: [ ]   blood in stool  [ ]   hematemesis GENITOURINARY:  [ ]   dysuria  [ ]   hematuria PSYCHIATRIC:  [ ]  history of major depression INTEGUMENTARY:  [ ]  rashes  [ ]  ulcers CONSTITUTIONAL:  [ ]  fever   [ ]  chills  PHYSICAL EXAMINATION:  General: The patient is a well-nourished male, in no acute distress. Vital signs are BP 146/70  Pulse  43  Temp(Src) 98.3 F (36.8 C) (Oral)  Resp 22  Ht 6\' 3"  (1.905 m)  Wt 161 lb (73.029 kg)  BMI 20.12 kg/m2  SpO2 98% Pulmonary: There is a good air exchange  Abdomen: Soft. Does have tenderness over his easily palpable infrarenal aneurysm. Reports a palpation of his aneurysm causes pain to extend into his left groin and lower quadrant. Musculoskeletal: There are no major deformities.  There is no significant extremity pain. Neurologic: No focal weakness or paresthesias are detected, Skin: There are no ulcer or rashes noted. Psychiatric: The patient has normal affect. Cardiovascular: 2+ radial, 2+ femoral, 2+ popliteal, 2+ posterior tibial pulses bilaterally with no evidence of peripheral artery aneurysm   CT of his abdomen and pelvis with IV contrast reveals 4.5 cm infrarenal abdominal aortic aneurysm with some ectasia of his iliac arteries  bilaterally. Extensive mural thrombus. No evidence of retroperitoneal hematoma. Patient does have a solitary left small kidney stone in the renal pelvis with no hydroureter.  Impression and Plan:  4.5 cm infrarenal abdominal aortic aneurysm by CT scan with no evidence of rupture. Concerning with new onset of abdominal and left flank pain with no other identifiable cause. Pain specifically over the aneurysm and with palpation extending into the left lower quadrant. I discussed this at great length with the patient and his wife present. Explain that there is no way of ruling out the aneurysm as a source for his tenderness and abdominal pain. Explain that it is likely this could be some other unrelated source. Will admit to 3 south step down and keep n.p.o. Plan the stent graft repair later today. Discussed the procedure stent graft repair and also the procedure for open aneurysm repair. His anatomy appears to be quite good for stent grafting.    Safiyah Cisney [facility] [place] Office: [phone]

## 2013-09-01 NOTE — Progress Notes (Signed)
    Subjective  -  The patient was admitted by Dr. early last night with a symptomatic abdominal aortic aneurysm.  The patient states this morning that his pain is a little better.   Physical Exam:  Abdomen is soft.  He does have tenderness on the left lateral side of his aorta. Palpable femoral pulses Palpable pedal pulses Respirations are nonlabored CV: Regular rate and rhythm       Assessment/Plan:  Symptomatic abdominal aortic aneurysm  I discussed the CT findings with the patient.  It appears that his abdominal pain is most likely secondary to his abdominal aortic aneurysm.  I have recommended proceeding with endovascular repair.  I discussed the risks and benefits of the procedure which include but are not limited to arterial injury, bleeding, intestinal ischemia, and cardiopulmonary complications.  The patient understands that there is a possibility that fixing the aneurysm may not completely alleviate his abdominal pain.  He wishes to proceed.  I will do this early this morning  BRABHAM IV, V. WELLS 09/01/2013 11:08 AM --  Filed Vitals:   09/01/13 1000  BP:   Pulse: 48  Temp:   Resp: 31    Intake/Output Summary (Last 24 hours) at 09/01/13 1108 Last data filed at 09/01/13 0801  Gross per 24 hour  Intake    100 ml  Output    200 ml  Net   -100 ml     Laboratory CBC    Component Value Date/Time   WBC 9.2 08/31/2013 2207   HGB 16.3 08/31/2013 2211   HCT 48.0 08/31/2013 2211   PLT 192 08/31/2013 2207    BMET    Component Value Date/Time   NA 140 08/31/2013 2211   K 3.7 08/31/2013 2211   CL 106 08/31/2013 2211   CO2 25 08/31/2013 2207   GLUCOSE 115* 08/31/2013 2211   BUN 18 08/31/2013 2211   CREATININE 0.80 08/31/2013 2211   CALCIUM 10.5 08/31/2013 2207   GFRNONAA >90 08/31/2013 2207   GFRAA >90 08/31/2013 2207    COAG No results found for this basename: INR, PROTIME   No results found for this basename: PTT    Antibiotics Anti-infectives   Start      Dose/Rate Route Frequency Ordered Stop   09/02/13 0600  [MAR Hold]  cefUROXime (ZINACEF) 1.5 g in dextrose 5 % 50 mL IVPB     (On MAR Hold since 09/01/13 1014)   1.5 g 100 mL/hr over 30 Minutes Intravenous On call to O.R. 09/01/13 1610 09/03/13 0559       V. Leia Alf, M.D. [facility] Office: [phone] Pager:  [phone]

## 2013-09-02 ENCOUNTER — Telehealth: Payer: Self-pay | Admitting: Surgery

## 2013-09-02 ENCOUNTER — Other Ambulatory Visit: Payer: Self-pay

## 2013-09-02 ENCOUNTER — Other Ambulatory Visit: Payer: Self-pay | Admitting: *Deleted

## 2013-09-02 DIAGNOSIS — I714 Abdominal aortic aneurysm, without rupture, unspecified: Secondary | ICD-10-CM

## 2013-09-02 DIAGNOSIS — Z48812 Encounter for surgical aftercare following surgery on the circulatory system: Secondary | ICD-10-CM

## 2013-09-02 DIAGNOSIS — I6529 Occlusion and stenosis of unspecified carotid artery: Secondary | ICD-10-CM

## 2013-09-02 LAB — BASIC METABOLIC PANEL
ANION GAP: 11 (ref 5–15)
BUN: 12 mg/dL (ref 6–23)
CALCIUM: 9.4 mg/dL (ref 8.4–10.5)
CHLORIDE: 101 meq/L (ref 96–112)
CO2: 25 mEq/L (ref 19–32)
Creatinine, Ser: 0.71 mg/dL (ref 0.50–1.35)
GFR calc non Af Amer: >90 mL/min (ref >90)
Glucose, Bld: 114 mg/dL — ABNORMAL HIGH (ref 70–99)
Potassium: 4.4 mEq/L (ref 3.7–5.3)
Sodium: 137 mEq/L (ref 137–147)

## 2013-09-02 LAB — CBC
HCT: 38.3 % — ABNORMAL LOW (ref 39.0–52.0)
Hemoglobin: 12.7 g/dL — ABNORMAL LOW (ref 13.0–17.0)
MCH: 30.2 pg (ref 26.0–34.0)
MCHC: 33.2 g/dL (ref 30.0–36.0)
MCV: 91.2 fL (ref 78.0–100.0)
PLATELETS: 186 10*3/uL (ref 150–400)
RBC: 4.2 MIL/uL — ABNORMAL LOW (ref 4.22–5.81)
RDW: 15.7 % — ABNORMAL HIGH (ref 11.5–15.5)
WBC: 10.5 10*3/uL (ref 4.0–10.5)

## 2013-09-02 MED ORDER — OXYCODONE-ACETAMINOPHEN 5-325 MG PO TABS: 1.0000 | ORAL_TABLET | Freq: Four times a day (QID) | ORAL | Status: DC | PRN

## 2013-09-02 MED ORDER — ATORVASTATIN CALCIUM 10 MG PO TABS: 10.0000 mg | ORAL_TABLET | Freq: Every day | ORAL | Status: DC

## 2013-09-02 NOTE — Progress Notes (Signed)
Pt transferred per MD order. All discharge instructions reviewed and all questions answered.

## 2013-09-02 NOTE — Progress Notes (Addendum)
     Subjective  - Doing well he has eaten and ambulated.  He needs to void prior to going home.   Objective 154/75 50 98.4 F (36.9 C) (Oral) 19 96%  Intake/Output Summary (Last 24 hours) at 09/02/13 8891 Last data filed at 09/02/13 0324  Gross per 24 hour  Intake   1800 ml  Output   1700 ml  Net    100 ml    Palpable DP pulses bil 2+ Groins soft without hematoma, dressings dry   Assessment/Planning: POD #1  Procedure: #1: Bilateral ultrasound-guided common femoral artery access  #2: Endovascular repair of abdominal aortic aneurysm  #3: Distal extension x1  #4: Catheter in aorta x2  #5: Abdominal aortogram  D/C home today after voiding independently F/U in 4 weeks with CTA abdomin and pelvis He will F/U with his primary Dr. Ammie Ferrier, [facility] 09/02/2013 7:12 AM --  Laboratory Lab Results:  Recent Labs  08/31/13 2207 08/31/13 2211 09/02/13 0500  WBC 9.2  --  10.5  HGB 14.7 16.3 12.7*  HCT 42.8 48.0 38.3*  PLT 192  --  186   BMET  Recent Labs  08/31/13 2207 08/31/13 2211 09/02/13 0500  NA 141 140 137  K 3.8 3.7 4.4  CL 102 106 101  CO2 25  --  25  GLUCOSE 109* 115* 114*  BUN 17 18 12   CREATININE 0.78 0.80 0.71  CALCIUM 10.5  --  9.4    COAG No results found for this basename: INR, PROTIME   No results found for this basename: PTT      Groins soft, abd soft D/c home Starting statin Carotid dopplers when he returns   [employer]

## 2013-09-02 NOTE — Telephone Encounter (Signed)
Message copied by Gena Fray on Fri Sep 02, 2013 10:41 AM ------      Message from: Mena Goes      Created: Fri Sep 02, 2013  8:43 AM      Regarding: Schedule                   ----- Message -----         From: Ulyses Amor, PA-C         Sent: 09/02/2013   7:17 AM           To: Vvs Charge Pool            F/U in the office with Dr. Trula Slade 4 weeks with CTA abdomin and pelvis s/p EVAR ------

## 2013-09-02 NOTE — Discharge Summary (Signed)
[facility] Discharge Summary   Patient ID:  Duane Delgado MRN: [id number] DOB/AGE: [birth year] 62 y.o.  Admit date: 08/31/2013 Discharge date: 09/02/2013 Date of Surgery: 08/31/2013 - 09/01/2013 Surgeon: Juliann Mule): Serafina Mitchell, MD  Admission Diagnosis: Abdominal pain, unspecified abdominal location [789.00]  Discharge Diagnoses:  Abdominal pain, unspecified abdominal location [789.00]  Secondary Diagnoses: Past Medical History  Diagnosis Date  . Glaucoma     Procedure(s): ABDOMINAL AORTIC ENDOVASCULAR STENT GRAFT  Discharged Condition: good  HPI: Patient is a 63 year old gentleman who denies any prior similar symptoms New onset abdominal pain CT scan. I was called approximately 3 AM regarding finding of a 4.5 cm infrarenal abdominal aortic aneurysm with no evidence of leak.     Hospital Course:  Duane Delgado is a 63 y.o. male is S/P  Procedure(s): ABDOMINAL AORTIC ENDOVASCULAR STENT GRAFT He underwent EVAR repair by Dr. Trula Slade.  Post-op bilateral groins soft without hematoma. Distal PT/DP pulses palpable.  He had an uneventful stay over night.  He is ambulating, taking PO's well and has voided independently.  He will be discharged on Lipitor 10 mg PO daily.  He will f/u with his primary care Dr. Sharyn Lull.  We will set him up for a CTA of the abdomin and pelvis prior to his 1 month visit with Dr. Trula Slade in our office.  Significant Diagnostic Studies: CBC Lab Results  Component Value Date   WBC 10.5 09/02/2013   HGB 12.7* 09/02/2013   HCT 38.3* 09/02/2013   MCV 91.2 09/02/2013   PLT 186 09/02/2013    BMET    Component Value Date/Time   NA 137 09/02/2013 0500   K 4.4 09/02/2013 0500   CL 101 09/02/2013 0500   CO2 25 09/02/2013 0500   GLUCOSE 114* 09/02/2013 0500   BUN 12 09/02/2013 0500   CREATININE 0.71 09/02/2013 0500   CALCIUM 9.4 09/02/2013 0500   GFRNONAA >90 09/02/2013 0500   GFRAA >90 09/02/2013 0500   COAG No results found for this  basename: INR, PROTIME     Disposition:  Discharge to :Home Discharge Instructions   Call MD for:  redness, tenderness, or signs of infection (pain, swelling, bleeding, redness, odor or green/yellow discharge around incision site)    Complete by:  As directed      Call MD for:  severe or increased pain, loss or decreased feeling  in affected limb(s)    Complete by:  As directed      Call MD for:  temperature >100.5    Complete by:  As directed      Discharge instructions    Complete by:  As directed   You may remove the dressings in 24 hours and shower them place band aides over the incision.     Driving Restrictions    Complete by:  As directed   No driving for 2 weeks     Lifting restrictions    Complete by:  As directed   No lifting for 6 weeks     Resume previous diet    Complete by:  As directed             Medication List         acetaminophen 500 MG tablet  Commonly known as:  TYLENOL  Take 500 mg by mouth daily as needed for mild pain.     atorvastatin 10 MG tablet  Commonly known as:  LIPITOR  Take 1 tablet (10 mg total) by mouth daily.  latanoprost 0.005 % ophthalmic solution  Commonly known as:  XALATAN  Place 1 drop into both eyes at bedtime.     oxyCODONE-acetaminophen 5-325 MG per tablet  Commonly known as:  PERCOCET/ROXICET  Take 1 tablet by mouth every 6 (six) hours as needed for moderate pain.     sodium-potassium bicarbonate Tbef dissolvable tablet  Commonly known as:  ALKA-SELTZER GOLD  Take 2 tablets by mouth daily as needed (for indigestion).       Verbal and written Discharge instructions given to the patient. Wound care per Discharge AVS     Follow-up Information   Follow up with Trula Slade IV, [place], MD In 4 weeks. (sent message to office)    Specialty:  Vascular Surgery   Contact information:   [address] [place] [address] [phone]       Signed: Laurence Slate [facility] 09/02/2013, 3:12 PM   - For [facility] Registry  use --- Instructions: Press F2 to tab through selections.  Delete question if not applicable.   Post-op:  Time to Extubation: [x ] In OR, [ ]  < 12 hrs, [ ]  12-24 hrs, [ ]  >=24 hrs Vasopressors Req. Post-op: No MI: [ ]  No, [ ]  Troponin only, [ ]  EKG or Clinical New Arrhythmia: No CHF: No ICU Stay: 1 days Transfusion: No  If yes, 0 units given  Complications: Resp failure: [x ] none, [ ]  Pneumonia, [ ]  Ventilator Chg in renal function: [x ] none, [ ]  Inc. Cr > 0.5, [ ]  Temp. Dialysis, [ ]  Permanent dialysis Leg ischemia: [ x] No, [ ]  Yes, no Surgery needed, [ ]  Yes, Surgery needed, [ ]  Amputation Bowel ischemia: [x ] No, [ ]  Medical Rx, [ ]  Surgical Rx Wound complication: [x ] No, [ ]  Superficial separation/infection, [ ]  Return to OR Return to OR: No  Return to OR for bleeding: No Stroke: [x ] None, [ ]  Minor, [ ]  Major  Discharge medications: Statin use:  Yes ASA use:  No  for medical reason   Plavix use:  No  for medical reason   Beta blocker use:  No  for medical reason

## 2013-09-05 ENCOUNTER — Encounter (HOSPITAL_COMMUNITY): Payer: Self-pay | Admitting: Surgery

## 2013-09-05 ENCOUNTER — Encounter: Payer: Self-pay | Admitting: *Deleted

## 2013-09-05 LAB — TYPE AND SCREEN
ABO/RH(D): A POS
Antibody Screen: NEGATIVE
Unit division: 0
Unit division: 0

## 2013-09-08 NOTE — Discharge Summary (Signed)
I agree with the above  Duane Delgado 

## 2013-09-26 ENCOUNTER — Other Ambulatory Visit (HOSPITAL_COMMUNITY): Payer: BC Managed Care – PPO

## 2013-09-27 ENCOUNTER — Ambulatory Visit (HOSPITAL_COMMUNITY)
Admission: RE | Admit: 2013-09-27 | Discharge: 2013-09-27 | Disposition: A | Discharge status: Home / Self Care | Payer: BC Managed Care – PPO | Source: Ambulatory Visit | Attending: Surgery | Admitting: Surgery

## 2013-09-27 ENCOUNTER — Other Ambulatory Visit: Payer: Self-pay | Admitting: Surgery

## 2013-09-27 DIAGNOSIS — Z48812 Encounter for surgical aftercare following surgery on the circulatory system: Secondary | ICD-10-CM

## 2013-09-27 DIAGNOSIS — I6529 Occlusion and stenosis of unspecified carotid artery: Secondary | ICD-10-CM

## 2013-09-30 ENCOUNTER — Encounter: Payer: Self-pay | Admitting: Surgery

## 2013-10-03 ENCOUNTER — Encounter: Payer: Self-pay | Admitting: Surgery

## 2013-10-03 ENCOUNTER — Encounter: Payer: BC Managed Care – PPO | Admitting: Surgery

## 2013-10-03 ENCOUNTER — Ambulatory Visit (INDEPENDENT_AMBULATORY_CARE_PROVIDER_SITE_OTHER): Payer: Self-pay | Admitting: Surgery

## 2013-10-03 ENCOUNTER — Ambulatory Visit
Admission: RE | Admit: 2013-10-03 | Discharge: 2013-10-03 | Disposition: A | Discharge status: Home / Self Care | Payer: BC Managed Care – PPO | Source: Ambulatory Visit | Attending: Surgery | Admitting: Surgery

## 2013-10-03 VITALS — BP 144/81 | HR 44 | Ht 75.0 in | Wt 164.9 lb

## 2013-10-03 DIAGNOSIS — I714 Abdominal aortic aneurysm, without rupture, unspecified: Secondary | ICD-10-CM | POA: Insufficient documentation

## 2013-10-03 DIAGNOSIS — Z48812 Encounter for surgical aftercare following surgery on the circulatory system: Secondary | ICD-10-CM

## 2013-10-03 MED ORDER — IOHEXOL 350 MG/ML SOLN
80.0000 mL | Freq: Once | INTRAVENOUS | Status: AC | PRN
  Administered 2013-10-03: 80 mL via INTRAVENOUS

## 2013-10-03 NOTE — Addendum Note (Signed)
Addended by: Mena Goes on: 10/03/2013 05:45 PM   Modules accepted: Orders

## 2013-10-03 NOTE — Progress Notes (Signed)
The patient is back today for his first postoperative followup.  He presented to the emergency department on 09/01/2013 with abdominal pain.  He was found to have a 4.5 cm infrarenal abdominal aortic aneurysm which was very tender to touch.  He underwent endovascular repair of his aneurysm the following morning and was discharged home on postoperative day one.  He reports no complications and no problems.  His abdominal pain completely resolved with repair of his aneurysm.  On examination his abdomen is soft and both groin incisions have completely healed.  CT scan today shows a slight decrease in size of his native aneurysm measuring 4.1 cm.  There is a type II endoleak.  Overall the patient is doing very well.  He'll followup in 6 months with an abdominal ultrasound.

## 2014-04-12 ENCOUNTER — Emergency Department (HOSPITAL_COMMUNITY)
Admission: EM | Admit: 2014-04-12 | Discharge: 2014-04-12 | Disposition: A | Discharge status: Home / Self Care | Payer: No Typology Code available for payment source | Source: Home / Self Care | Attending: Family Medicine | Admitting: Family Medicine

## 2014-04-12 ENCOUNTER — Encounter (HOSPITAL_COMMUNITY): Payer: Self-pay | Admitting: Emergency Medicine

## 2014-04-12 DIAGNOSIS — J069 Acute upper respiratory infection, unspecified: Secondary | ICD-10-CM

## 2014-04-12 NOTE — Discharge Instructions (Signed)
Thank you for coming in today.  Bradycardia Bradycardia is a term for a heart rate (pulse) that, in adults, is slower than 60 beats per minute. A normal rate is 60 to 100 beats per minute. A heart rate below 60 beats per minute may be normal for some adults with healthy hearts. If the rate is too slow, the heart may have trouble pumping the volume of blood the body needs. If the heart rate gets too low, blood flow to the brain may be decreased and may make you feel lightheaded, dizzy, or faint. The heart has a natural pacemaker in the top of the heart called the SA node (sinoatrial or sinus node). This pacemaker sends out regular electrical signals to the muscle of the heart, telling the heart muscle when to beat (contract). The electrical signal travels from the upper parts of the heart (atria) through the AV node (atrioventricular node), to the lower chambers of the heart (ventricles). The ventricles squeeze, pumping the blood from your heart to your lungs and to the rest of your body. CAUSES   Problem with the heart's electrical system.  Problem with the heart's natural pacemaker.  Heart disease, damage, or infection.  Medications.  Problems with minerals and salts (electrolytes). SYMPTOMS   Fainting (syncope).  Fatigue and weakness.  Shortness of breath (dyspnea).  Chest pain (angina).  Drowsiness.  Confusion. DIAGNOSIS   An electrocardiogram (ECG) can help your caregiver determine the type of slow heart rate you have.  If the cause is not seen on an ECG, you may need to wear a heart monitor that records your heart rhythm for several hours or days.  Blood tests. TREATMENT   Electrolyte supplements.  Medications.  Withholding medication which is causing a slow heart rate.  Pacemaker placement. SEEK IMMEDIATE MEDICAL CARE IF:   You feel lightheaded or faint.  You develop an irregular heart rate.  You feel chest pain or have trouble breathing. MAKE SURE YOU:    Understand these instructions.  Will watch your condition.  Will get help right away if you are not doing well or get worse. Document Released: 09/21/2001 Document Revised: 03/24/2011 Document Reviewed: 04/06/2013 [facility] Patient Information 2015 [place], [place]. This information is not intended to replace advice given to you by your health care provider. Make sure you discuss any questions you have with your health care provider.

## 2014-04-12 NOTE — ED Notes (Signed)
C/o cold sx onset 4 days Sx include: cough, chills, HA, congestion, runny nose Denies fevers Taking OTC cold meds w/no relief Alert, no signs of acute distress.

## 2014-04-12 NOTE — ED Provider Notes (Signed)
Duane Delgado is a 64 y.o. male who presents to Urgent Care today for flu. Several days ago patient missed several days of work due to fevers chills cough congestion. He is a much better but requires a note to return to work. He would like to return to work on Friday, April 1. He denies any chest pains or palpitations or shortness of breath. He denies any syncope. He notes that his heart rate is always low. He feels fine.   Past Medical History  Diagnosis Date  . Glaucoma    Past Surgical History  Procedure Laterality Date  . Abdominal aortic endovascular stent graft N/A 09/01/2013    Procedure: ABDOMINAL AORTIC ENDOVASCULAR STENT GRAFT;  Surgeon: Serafina Mitchell, MD;  [facility] OR;  Service: Vascular;  Laterality: N/A;   History  Substance Use Topics  . Smoking status: Current Every Day Smoker -- 0.50 packs/day    Types: Cigarettes  . Smokeless tobacco: Not on file  . Alcohol Use: Yes     Comment: drinks 3-4 drinks of liquor per day   ROS as above Medications: No current facility-administered medications for this encounter.   Current Outpatient Prescriptions  Medication Sig Dispense Refill  . acetaminophen (TYLENOL) 500 MG tablet Take 500 mg by mouth daily as needed for mild pain.     Marland Kitchen atorvastatin (LIPITOR) 10 MG tablet Take 1 tablet (10 mg total) by mouth daily. 30 tablet 1  . latanoprost (XALATAN) 0.005 % ophthalmic solution Place 1 drop into both eyes at bedtime.     Marland Kitchen oxyCODONE-acetaminophen (PERCOCET/ROXICET) 5-325 MG per tablet Take 1 tablet by mouth every 6 (six) hours as needed for moderate pain. 30 tablet 0  . sodium-potassium bicarbonate (ALKA-SELTZER GOLD) TBEF dissolvable tablet Take 2 tablets by mouth daily as needed (for indigestion).     No Known Allergies   Exam:  BP 125/75 mmHg  Pulse 47  Temp(Src) 97.8 F (36.6 C) (Oral)  Resp 14  SpO2 96% Gen: Well NAD HEENT: EOMI,  MMM normal posterior pharynx and tympanic membranes Lungs: Normal work of  breathing. CTABL Heart: Bradycardia but regular. Auscultated rate at 55 bpm per my check. no MRG Abd: NABS, Soft. Nondistended, Nontender Exts: Brisk capillary refill, warm and well perfused.   No results found for this or any previous visit (from the past 24 hour(s)). No results found.  Assessment and Plan: 64 y.o. male with resolved viral URI. Return to work. Return as needed.  Discussed warning signs or symptoms. Please see discharge instructions. Patient expresses understanding.     Gregor Hams, MD 04/12/14 [phone]

## 2014-04-17 ENCOUNTER — Ambulatory Visit: Payer: BC Managed Care – PPO | Admitting: Surgery

## 2014-04-17 ENCOUNTER — Other Ambulatory Visit (HOSPITAL_COMMUNITY): Payer: BC Managed Care – PPO

## 2014-04-24 ENCOUNTER — Ambulatory Visit: Payer: Self-pay | Admitting: Surgery

## 2014-04-24 ENCOUNTER — Other Ambulatory Visit (HOSPITAL_COMMUNITY): Payer: Self-pay

## 2014-05-12 ENCOUNTER — Encounter: Payer: Self-pay | Admitting: Surgery

## 2014-05-15 ENCOUNTER — Inpatient Hospital Stay (HOSPITAL_COMMUNITY): Admission: RE | Admit: 2014-05-15 | Payer: Self-pay | Source: Ambulatory Visit

## 2014-05-15 ENCOUNTER — Ambulatory Visit: Payer: Self-pay | Admitting: Surgery

## 2014-05-25 ENCOUNTER — Encounter: Payer: Self-pay | Admitting: Surgery

## 2014-05-26 ENCOUNTER — Ambulatory Visit (INDEPENDENT_AMBULATORY_CARE_PROVIDER_SITE_OTHER): Payer: No Typology Code available for payment source | Admitting: Surgery

## 2014-05-26 ENCOUNTER — Ambulatory Visit (HOSPITAL_COMMUNITY)
Admission: RE | Admit: 2014-05-26 | Discharge: 2014-05-26 | Disposition: A | Discharge status: Home / Self Care | Payer: No Typology Code available for payment source | Source: Ambulatory Visit | Attending: Surgery | Admitting: Surgery

## 2014-05-26 ENCOUNTER — Encounter: Payer: Self-pay | Admitting: Surgery

## 2014-05-26 ENCOUNTER — Other Ambulatory Visit: Payer: Self-pay | Admitting: *Deleted

## 2014-05-26 VITALS — BP 143/85 | HR 40 | Resp 16 | Ht 75.0 in | Wt 169.0 lb

## 2014-05-26 DIAGNOSIS — I714 Abdominal aortic aneurysm, without rupture, unspecified: Secondary | ICD-10-CM

## 2014-05-26 DIAGNOSIS — Z48812 Encounter for surgical aftercare following surgery on the circulatory system: Secondary | ICD-10-CM | POA: Insufficient documentation

## 2014-05-26 NOTE — Progress Notes (Signed)
Filed Vitals:   05/26/14 0953 05/26/14 1003  BP: 150/87 143/85  Pulse: 43 40  Resp: 16   Height: 6\' 3"  (1.905 m)   Weight: 169 lb (76.658 kg)    Body mass index is 21.12 kg/(m^2).

## 2014-05-26 NOTE — Progress Notes (Signed)
Patient name: Duane Delgado MRN: [id number] DOB: 1950/10/02 Sex: male     Chief Complaint  Patient presents with  . AAA    6 month f/u  lab study prior    HISTORY OF PRESENT ILLNESS: The patient is back today for follow-up.  He initially presented to the emergency department on 09/01/2013 with abdominal pain. He was found to have a 4.5 cm infrarenal abdominal aortic aneurysm which was very tender to touch. He underwent endovascular repair of his aneurysm the following morning and was discharged home on postoperative day one.  His abdominal pain completely resolved with repair of his aneurysm.  His first post operative CT scan showed a slight decrease in size of his native aneurysm measuring 4.1 cm. There was a type II endoleak.  The patient does not endorse any complaints at this time.  He denies abdominal pain.  He denies any claudication symptoms.  He denies any neurologic events.   Past Medical History  Diagnosis Date  . Glaucoma     Past Surgical History  Procedure Laterality Date  . Abdominal aortic endovascular stent graft N/A 09/01/2013    Procedure: ABDOMINAL AORTIC ENDOVASCULAR STENT GRAFT;  Surgeon: Serafina Mitchell, MD;  Location: [facility] OR;  Service: Vascular;  Laterality: N/A;    History   Social History  . Marital Status: Married    Spouse Name: N/A  . Number of Children: N/A  . Years of Education: N/A   Occupational History  . Not on file.   Social History Main Topics  . Smoking status: Current Every Day Smoker -- 0.50 packs/day    Types: Cigarettes  . Smokeless tobacco: Never Used  . Alcohol Use: 0.0 oz/week    0 Standard drinks or equivalent per week     Comment: drinks 3-4 drinks of liquor per day  . Drug Use: No  . Sexual Activity: Not on file   Other Topics Concern  . Not on file   Social History Narrative    No family history on file.  Allergies as of 05/26/2014  . (No Known Allergies)    Current Outpatient Prescriptions on File  Prior to Visit  Medication Sig Dispense Refill  . acetaminophen (TYLENOL) 500 MG tablet Take 500 mg by mouth daily as needed for mild pain.     Marland Kitchen atorvastatin (LIPITOR) 10 MG tablet Take 1 tablet (10 mg total) by mouth daily. 30 tablet 1  . latanoprost (XALATAN) 0.005 % ophthalmic solution Place 1 drop into both eyes at bedtime.     Marland Kitchen oxyCODONE-acetaminophen (PERCOCET/ROXICET) 5-325 MG per tablet Take 1 tablet by mouth every 6 (six) hours as needed for moderate pain. (Patient not taking: Reported on 05/26/2014) 30 tablet 0  . sodium-potassium bicarbonate (ALKA-SELTZER GOLD) TBEF dissolvable tablet Take 2 tablets by mouth daily as needed (for indigestion).     No current facility-administered medications on file prior to visit.     REVIEW OF SYSTEMS: Cardiovascular: No chest pain, chest pressure, palpitations, orthopnea, or dyspnea on exertion. No claudication or rest pain,  No history of DVT or phlebitis. Pulmonary: No productive cough, asthma or wheezing. Neurologic: No weakness, paresthesias, aphasia, or amaurosis. No dizziness. Hematologic: No bleeding problems or clotting disorders. Musculoskeletal: No joint pain or joint swelling. Gastrointestinal: No blood in stool or hematemesis Genitourinary: No dysuria or hematuria. Psychiatric:: No history of major depression. Integumentary: No rashes or ulcers. Constitutional: No fever or chills.  PHYSICAL EXAMINATION:   Vital signs are  [employer]  Vitals:   05/26/14 0953 05/26/14 1003  BP: 150/87 143/85  Pulse: 43 40  Resp: 16   Height: 6\' 3"  (1.905 m)   Weight: 169 lb (76.658 kg)    Body mass index is 21.12 kg/(m^2). General: The patient appears their stated age. HEENT:  No gross abnormalities Pulmonary:  Non labored breathing Abdomen: Soft and non-tender.  No pulsatile mass Musculoskeletal: There are no major deformities. Neurologic: No focal weakness or paresthesias are detected, Skin: There are no ulcer or rashes  noted. Psychiatric: The patient has normal affect. Cardiovascular: There is a regular rate and rhythm without significant murmur appreciated.  I can palpate a right posterior tibial but no pedal pulses on the left.   Diagnostic Studies Duplex ultrasound was ordered and reviewed.  Maximum diameter is 3.7 x 3.9.  No endoleak visualized.  Assessment: Status post emergent endovascular aneurysm repair for symptomatic complaints Plan: Patient is doing very well.  Ultrasound today did not identify the endoleak that was initially seen on his CT scan.  The aneurysm continues to decrease in size with maximum diameter of 3.7 x 3.9 cm.  The patient will follow up in one year with a repeat ultrasound.  When the patient returned I will get lower extremity duplex studies to evaluate for popliteal aneurysm.  Eldridge Abrahams, M.D. [facility] Office: [phone] Pager:  [phone]

## 2014-05-29 ENCOUNTER — Other Ambulatory Visit: Payer: Self-pay | Admitting: *Deleted

## 2014-05-29 DIAGNOSIS — I714 Abdominal aortic aneurysm, without rupture, unspecified: Secondary | ICD-10-CM

## 2015-05-29 ENCOUNTER — Encounter: Payer: Self-pay | Admitting: Family

## 2015-06-04 ENCOUNTER — Ambulatory Visit (HOSPITAL_COMMUNITY): Payer: Self-pay

## 2015-06-04 ENCOUNTER — Ambulatory Visit: Payer: Self-pay | Admitting: Family

## 2015-06-04 ENCOUNTER — Ambulatory Visit (HOSPITAL_COMMUNITY): Payer: Self-pay | Attending: Surgery

## 2015-06-18 ENCOUNTER — Encounter: Payer: Self-pay | Admitting: Cardiology

## 2016-06-06 DIAGNOSIS — Z1159 Encounter for screening for other viral diseases: Secondary | ICD-10-CM | POA: Diagnosis not present

## 2016-06-06 DIAGNOSIS — Z125 Encounter for screening for malignant neoplasm of prostate: Secondary | ICD-10-CM | POA: Diagnosis not present

## 2016-06-06 DIAGNOSIS — Z0000 Encounter for general adult medical examination without abnormal findings: Secondary | ICD-10-CM | POA: Diagnosis not present

## 2016-06-06 DIAGNOSIS — K635 Polyp of colon: Secondary | ICD-10-CM | POA: Diagnosis not present

## 2016-06-06 DIAGNOSIS — E7800 Pure hypercholesterolemia, unspecified: Secondary | ICD-10-CM | POA: Diagnosis not present

## 2016-06-06 DIAGNOSIS — L723 Sebaceous cyst: Secondary | ICD-10-CM | POA: Diagnosis not present

## 2016-06-06 DIAGNOSIS — N400 Enlarged prostate without lower urinary tract symptoms: Secondary | ICD-10-CM | POA: Diagnosis not present

## 2016-06-12 DIAGNOSIS — H401133 Primary open-angle glaucoma, bilateral, severe stage: Secondary | ICD-10-CM | POA: Diagnosis not present

## 2016-06-12 DIAGNOSIS — H2513 Age-related nuclear cataract, bilateral: Secondary | ICD-10-CM | POA: Diagnosis not present

## 2016-06-27 DIAGNOSIS — K5730 Diverticulosis of large intestine without perforation or abscess without bleeding: Secondary | ICD-10-CM | POA: Diagnosis not present

## 2016-06-27 DIAGNOSIS — Z86010 Personal history of colonic polyps: Secondary | ICD-10-CM | POA: Diagnosis not present

## 2016-06-27 DIAGNOSIS — D126 Benign neoplasm of colon, unspecified: Secondary | ICD-10-CM | POA: Diagnosis not present

## 2016-06-27 DIAGNOSIS — K640 First degree hemorrhoids: Secondary | ICD-10-CM | POA: Diagnosis not present

## 2016-06-27 DIAGNOSIS — K635 Polyp of colon: Secondary | ICD-10-CM | POA: Diagnosis not present

## 2016-07-01 DIAGNOSIS — D126 Benign neoplasm of colon, unspecified: Secondary | ICD-10-CM | POA: Diagnosis not present

## 2016-07-01 DIAGNOSIS — K635 Polyp of colon: Secondary | ICD-10-CM | POA: Diagnosis not present

## 2016-07-08 DIAGNOSIS — H402232 Chronic angle-closure glaucoma, bilateral, moderate stage: Secondary | ICD-10-CM | POA: Diagnosis not present

## 2016-07-08 DIAGNOSIS — H2513 Age-related nuclear cataract, bilateral: Secondary | ICD-10-CM | POA: Diagnosis not present

## 2016-07-14 ENCOUNTER — Encounter: Payer: Self-pay | Admitting: Family

## 2016-07-21 ENCOUNTER — Other Ambulatory Visit: Payer: Self-pay

## 2016-07-21 DIAGNOSIS — I714 Abdominal aortic aneurysm, without rupture, unspecified: Secondary | ICD-10-CM

## 2016-07-21 DIAGNOSIS — I724 Aneurysm of artery of lower extremity: Secondary | ICD-10-CM

## 2016-07-22 DIAGNOSIS — H402232 Chronic angle-closure glaucoma, bilateral, moderate stage: Secondary | ICD-10-CM | POA: Diagnosis not present

## 2016-07-28 ENCOUNTER — Ambulatory Visit (HOSPITAL_COMMUNITY)
Admission: RE | Admit: 2016-07-28 | Discharge: 2016-07-28 | Disposition: A | Discharge status: Home / Self Care | Payer: Medicare Other | Source: Ambulatory Visit | Attending: Family | Admitting: Family

## 2016-07-28 ENCOUNTER — Ambulatory Visit (INDEPENDENT_AMBULATORY_CARE_PROVIDER_SITE_OTHER)
Admission: RE | Admit: 2016-07-28 | Discharge: 2016-07-28 | Disposition: A | Discharge status: Home / Self Care | Payer: Medicare Other | Source: Ambulatory Visit | Attending: Vascular Surgery | Admitting: Vascular Surgery

## 2016-07-28 ENCOUNTER — Encounter: Payer: Self-pay | Admitting: Family

## 2016-07-28 ENCOUNTER — Ambulatory Visit (INDEPENDENT_AMBULATORY_CARE_PROVIDER_SITE_OTHER): Payer: Medicare Other | Admitting: Family

## 2016-07-28 VITALS — BP 144/80 | HR 42 | Temp 98.9°F | Resp 16 | Ht 75.0 in | Wt 165.0 lb

## 2016-07-28 DIAGNOSIS — R001 Bradycardia, unspecified: Secondary | ICD-10-CM | POA: Diagnosis not present

## 2016-07-28 DIAGNOSIS — I714 Abdominal aortic aneurysm, without rupture, unspecified: Secondary | ICD-10-CM

## 2016-07-28 DIAGNOSIS — Z95828 Presence of other vascular implants and grafts: Secondary | ICD-10-CM

## 2016-07-28 DIAGNOSIS — I724 Aneurysm of artery of lower extremity: Secondary | ICD-10-CM | POA: Diagnosis not present

## 2016-07-28 DIAGNOSIS — R0989 Other specified symptoms and signs involving the circulatory and respiratory systems: Secondary | ICD-10-CM | POA: Diagnosis not present

## 2016-07-28 NOTE — Patient Instructions (Signed)
Steps to Quit Smoking Smoking tobacco can be bad for your health. It can also affect almost every organ in your body. Smoking puts you and people around you at risk for many serious long-lasting (chronic) diseases. Quitting smoking is hard, but it is one of the best things that you can do for your health. It is never too late to quit. What are the benefits of quitting smoking? When you quit smoking, you lower your risk for getting serious diseases and conditions. They can include:  Lung cancer or lung disease.  Heart disease.  Stroke.  Heart attack.  Not being able to have children (infertility).  Weak bones (osteoporosis) and broken bones (fractures).  If you have coughing, wheezing, and shortness of breath, those symptoms may get better when you quit. You may also get sick less often. If you are pregnant, quitting smoking can help to lower your chances of having a baby of low birth weight. What can I do to help me quit smoking? Talk with your doctor about what can help you quit smoking. Some things you can do (strategies) include:  Quitting smoking totally, instead of slowly cutting back how much you smoke over a period of time.  Going to in-person counseling. You are more likely to quit if you go to many counseling sessions.  Using resources and support systems, such as: ? Online chats with a counselor. ? Phone quitlines. ? Printed self-help materials. ? Support groups or group counseling. ? Text messaging programs. ? Mobile phone apps or applications.  Taking medicines. Some of these medicines may have nicotine in them. If you are pregnant or breastfeeding, do not take any medicines to quit smoking unless your doctor says it is okay. Talk with your doctor about counseling or other things that can help you.  Talk with your doctor about using more than one strategy at the same time, such as taking medicines while you are also going to in-person counseling. This can help make  quitting easier. What things can I do to make it easier to quit? Quitting smoking might feel very hard at first, but there is a lot that you can do to make it easier. Take these steps:  Talk to your family and friends. Ask them to support and encourage you.  Call phone quitlines, reach out to support groups, or work with a counselor.  Ask people who smoke to not smoke around you.  Avoid places that make you want (trigger) to smoke, such as: ? Bars. ? Parties. ? Smoke-break areas at work.  Spend time with people who do not smoke.  Lower the stress in your life. Stress can make you want to smoke. Try these things to help your stress: ? Getting regular exercise. ? Deep-breathing exercises. ? Yoga. ? Meditating. ? Doing a body scan. To do this, close your eyes, focus on one area of your body at a time from head to toe, and notice which parts of your body are tense. Try to relax the muscles in those areas.  Download or buy apps on your mobile phone or tablet that can help you stick to your quit plan. There are many free apps, such as QuitGuide from the CDC (Centers for Disease Control and Prevention). You can find more support from smokefree.gov and other websites.  This information is not intended to replace advice given to you by your health care provider. Make sure you discuss any questions you have with your health care provider. Document Released: 10/26/2008 Document   Revised: 08/28/2015 Document Reviewed: 05/16/2014 Elsevier Interactive Patient Education  2018 [employer] Inc.  

## 2016-07-28 NOTE — Progress Notes (Addendum)
[facility] [place]  CC: Follow up s/p Endovascular Repair of Abdominal Aortic Aneurysm    History of Present Illness  Duane Delgado is a 66 y.o. (06-02-1950) male who is s/p EVAR repair of a symptomatic 4.5 cm AAA on 09-01-13. He returns today for follow up duplex.  His abdominal pain completely resolved with repair of his aneurysm.  His first post operative CT scan showed a slight decrease in size of his native aneurysm measuring 4.1 cm. There was a type II endoleak.   Dr. Trula Slade last evaluated pt on 05-26-14. At that time duplex ultrasound showed maximum diameter at 3.7 x 3.9.  No endoleak visualized. Patient was doing very well with no back or abdominal pain.  Ultrasound that day did not identify the endoleak that was initially seen on his CT scan.  The aneurysm continued to decrease in size with maximum diameter of 3.7 x 3.9 cm.  The patient was to follow up in one year with a repeat ultrasound, and duplex of his popliteal arteries to evaluate prominent popliteal pulses.  He seems to have missed his one year follow up.  He denies back pain or abdominal pain.  Pt denies any history of stroke or MI. He denies claudication symptoms with walking.   He walks a great deal throughout his day.   He states that all of his family members have a low heart rate, and they are all asymptomatic. He denies feeling light-headed, denies any chest pain or dyspnea, denies any syncopal or presyncopal episodes.   Pt Diabetic: No Pt smoker: smoker  (1/2 ppd, started at age 39 yrs)   Past Medical History:  Diagnosis Date  . Glaucoma    Past Surgical History:  Procedure Laterality Date  . ABDOMINAL AORTIC ENDOVASCULAR STENT GRAFT N/A 09/01/2013   Procedure: ABDOMINAL AORTIC ENDOVASCULAR STENT GRAFT;  Surgeon: Serafina Mitchell, MD;  Location: [facility];  Service: Vascular;  Laterality: N/A;   Social History Social History  Substance Use Topics  . Smoking status: Current Every Day  Smoker    Packs/day: 0.50    Types: Cigarettes  . Smokeless tobacco: Never Used  . Alcohol use 0.0 oz/week     Comment: drinks 3-4 drinks of liquor per day   Family History No family history on file. Current Outpatient Prescriptions on File Prior to Visit  Medication Sig Dispense Refill  . latanoprost (XALATAN) 0.005 % ophthalmic solution Place 1 drop into both eyes at bedtime.      No current facility-administered medications on file prior to visit.    No Known Allergies   ROS: See HPI for pertinent positives and negatives.  Physical Examination  Vitals:   07/28/16 0918  BP: (!) 144/80  Pulse: (!) 42  Resp: 16  Temp: 98.9 F (37.2 C)  TempSrc: Oral  SpO2: 100%  Weight: 165 lb (74.8 kg)  Height: 6\' 3"  (1.905 m)   Body mass index is 20.62 kg/m.  General: A&O x 3, WD, slim male  Pulmonary: Sym exp, respirations are non labored, good air movt, CTAB, no rales, rhonchi, or wheezing.  Cardiac: Regular rhythm, bradycardic (not on a beta blocker), no murmur appreciated  Vascular: Vessel Right Left  Radial 2+Palpable 2+Palpable  Carotid  without bruit  without bruit  Aorta Mildly palpable N/A  Femoral 2+Palpable 2+Palpable  Popliteal 3+ palpable 3+ palpable  PT 2+Palpable 2+Palpable  DP 2+Palpable 2+Palpable   Gastrointestinal: soft, NTND, -G/R, - HSM, - palpable masses, - CVAT B.  Musculoskeletal: M/S 5/5 throughout, extremities without ischemic changes.  Neurologic: Pain and light touch intact in extremities, Motor exam as listed above  Non-Invasive Vascular Imaging  EVAR Duplex (Date: 07/28/16)  AAA sac size: 4.2 cm x 4.2 cm; Right CIA: 2.4 cm; Left CIA: 1.9 cm  no endoleak detected  05-26-14 EVAR duplex: 3.7 cm x 3.9 cm  Medical Decision Making  OBIE KALLENBACH is a 66 y.o. male who presents s/p EVAR (Date: 09-01-13).  Pt is asymptomatic with an increase in sac size of 3 mm in 2+ years. I discussed with Dr. Donnetta Hutching pt asymptomatic status, known Type 2  endoleak history, and 4.2 cm today compared to 3.9 cm on 05-26-14.  Will schedule for CTA abd/pelvis in 6 months and see Dr. Trula Slade.  Last serum creatinine result on file was 0.71 on 09-02-13.   The patient was counseled re smoking cessation and given several free resources re smoking cessation.   I emphasized the importance of maximal medical management including strict control of blood pressure, blood glucose, and lipid levels, antiplatelet agents, obtaining regular exercise, and cessation of smoking.   Thank you for allowing [place] to participate in this patient's care.  Clemon Chambers, RN, MSN, FNP-C [facility] [place] Office: [phone]  Clinic Physician: Early on call   07/28/2016, 9:45 AM

## 2016-08-11 NOTE — Addendum Note (Signed)
Addended by: Lianne Cure A on: 08/11/2016 04:02 PM   Modules accepted: Orders

## 2016-08-13 DIAGNOSIS — H401133 Primary open-angle glaucoma, bilateral, severe stage: Secondary | ICD-10-CM | POA: Diagnosis not present

## 2016-08-27 DIAGNOSIS — H2513 Age-related nuclear cataract, bilateral: Secondary | ICD-10-CM | POA: Diagnosis not present

## 2016-08-27 DIAGNOSIS — H402232 Chronic angle-closure glaucoma, bilateral, moderate stage: Secondary | ICD-10-CM | POA: Diagnosis not present

## 2016-11-13 DIAGNOSIS — H401133 Primary open-angle glaucoma, bilateral, severe stage: Secondary | ICD-10-CM | POA: Diagnosis not present

## 2016-11-13 DIAGNOSIS — H2513 Age-related nuclear cataract, bilateral: Secondary | ICD-10-CM | POA: Diagnosis not present

## 2017-02-02 ENCOUNTER — Ambulatory Visit: Payer: Medicare Other | Admitting: Surgery

## 2017-02-02 ENCOUNTER — Ambulatory Visit
Admission: RE | Admit: 2017-02-02 | Discharge: 2017-02-02 | Disposition: A | Discharge status: Home / Self Care | Payer: Medicare Other | Source: Ambulatory Visit | Attending: Family | Admitting: Family

## 2017-02-02 DIAGNOSIS — I714 Abdominal aortic aneurysm, without rupture, unspecified: Secondary | ICD-10-CM

## 2017-02-02 DIAGNOSIS — Z95828 Presence of other vascular implants and grafts: Secondary | ICD-10-CM

## 2017-02-02 MED ORDER — IOPAMIDOL (ISOVUE-370) INJECTION 76%
75.0000 mL | Freq: Once | INTRAVENOUS | Status: AC | PRN
  Administered 2017-02-02: 75 mL via INTRAVENOUS

## 2017-02-16 ENCOUNTER — Encounter: Payer: Self-pay | Admitting: Surgery

## 2017-02-16 ENCOUNTER — Ambulatory Visit (INDEPENDENT_AMBULATORY_CARE_PROVIDER_SITE_OTHER): Payer: Medicare Other | Admitting: Surgery

## 2017-02-16 VITALS — BP 142/95 | HR 54 | Temp 98.9°F | Resp 16 | Ht 75.0 in | Wt 162.0 lb

## 2017-02-16 DIAGNOSIS — I714 Abdominal aortic aneurysm, without rupture, unspecified: Secondary | ICD-10-CM

## 2017-02-16 NOTE — Progress Notes (Signed)
[facility]  Patient name: Duane Delgado MRN: [id number] DOB: November 19, 1950 Sex: male   REASON FOR VISIT:    Follow up  [place]:    The patient is back today for follow-up.  He initially presented to the emergency department on 09/01/2013 with abdominal pain. He was found to have a 4.5 cm infrarenal abdominal aortic aneurysm which was very tender to touch. He underwent endovascular repair of his aneurysm the following morning and was discharged home on postoperative day one.  His abdominal pain completely resolved with repair of his aneurysm.  His first post operative CT scan showed a slight decrease in size of his native aneurysm measuring 4.1 cm. There was a type II endoleak.  He has no complaints today.  PAST MEDICAL HISTORY:   Past Medical History:  Diagnosis Date  . Glaucoma      FAMILY HISTORY:   No family history on file.  SOCIAL HISTORY:   Social History   Tobacco Use  . Smoking status: Current Every Day Smoker    Packs/day: 0.50    Types: Cigarettes  . Smokeless tobacco: Never Used  Substance Use Topics  . Alcohol use: Yes    Alcohol/week: 0.0 oz    Comment: drinks 3-4 drinks of liquor per day     ALLERGIES:   No Known Allergies   CURRENT MEDICATIONS:   Current Outpatient Medications  Medication Sig Dispense Refill  . latanoprost (XALATAN) 0.005 % ophthalmic solution Place 1 drop into both eyes at bedtime.      No current facility-administered medications for this visit.     REVIEW OF SYSTEMS:   [X]  denotes positive finding, [ ]  denotes negative finding Cardiac  Comments:  Chest pain or chest pressure:    Shortness of breath upon exertion:    Short of breath when lying flat:    Irregular heart rhythm:        Vascular    Pain in calf, thigh, or hip brought on by ambulation: x   Pain in feet at night that wakes you up from your sleep:     Blood clot  in your veins:    Leg swelling:         Pulmonary    Oxygen at home:    Productive cough:     Wheezing:         Neurologic    Sudden weakness in arms or legs:     Sudden numbness in arms or legs:     Sudden onset of difficulty speaking or slurred speech:    Temporary loss of vision in one eye:     Problems with dizziness:         Gastrointestinal    Blood in stool:     Vomited blood:         Genitourinary    Burning when urinating:     Blood in urine:        Psychiatric    Major depression:         Hematologic    Bleeding problems:    Problems with blood clotting too easily:        Skin    Rashes or ulcers:        Constitutional    Fever or chills:      PHYSICAL EXAM:   Vitals:   02/16/17 1318  BP: (!) 142/95  Pulse: (!) 54  Resp: 16  Temp: 98.9 F (37.2 C)  TempSrc: Oral  SpO2: 98%  Weight: 162 lb (73.5 kg)  Height: 6\' 3"  (1.905 m)    GENERAL: The patient is a well-nourished male, in no acute distress. The vital signs are documented above. CARDIAC: There is a regular rate and rhythm.  PULMONARY: Non-labored respirations  MUSCULOSKELETAL: There are no major deformities or cyanosis. NEUROLOGIC: No focal weakness or paresthesias are detected. SKIN: There are no ulcers or rashes noted. PSYCHIATRIC: The patient has a normal affect.  STUDIES:   CTA: 1. Abdominal aortic aneurysm status post endovascular aortic repair. Persistent type 2 endoleak but no evidence of sac enlargement compared to 10/03/2013.  NON-VASCULAR  1. Colonic diverticular disease without CT evidence of active inflammation. 2. Right lower lobe pulmonary cyst. 3. Right-sided renal cysts  MEDICAL ISSUES:   AAA: Follow-up 1 year with duplex.  Discussed no significant change in aneurysm size despite type II endoleak (4.4cm)    Annamarie Major, MD [facility] [phone] Pager [phone]

## 2017-05-27 ENCOUNTER — Telehealth: Payer: Self-pay | Admitting: *Deleted

## 2017-05-27 NOTE — Telephone Encounter (Signed)
Call from patient's wife. Duane Delgado c/o "severe abdominal pain x 2 days and request to see Dr. Trula Slade today or Thursday. Instructed to go to the [facility] ER for evaluation . Verbalized understanding.

## 2018-02-26 ENCOUNTER — Other Ambulatory Visit: Payer: Self-pay

## 2018-02-26 DIAGNOSIS — I714 Abdominal aortic aneurysm, without rupture, unspecified: Secondary | ICD-10-CM

## 2018-02-26 DIAGNOSIS — Z95828 Presence of other vascular implants and grafts: Secondary | ICD-10-CM

## 2018-02-28 NOTE — Progress Notes (Signed)
HISTORY AND PHYSICAL     CC:  Follow up Requesting Provider:  Alroy Dust, L.Marlou Sa, MD  HPI: This is a 68 y.o. male who underwent EVAR for symptomatic of 4.5cm AAA in August 2015.  Over his past visits, his aneurysmal sac has continued to decrease in size.  On his first CT scan, he had a type II endoleak.    He was seen a year ago by Dr. Trula Slade and underwent CT scan.  He had a CT scan that revealed a persistent type II endoleak but no evidence of sac enlargement compared to 10/03/13.  At his last visit he was doing well without back pain.   He was scheduled for one year follow up and he is here for that visit.    He has an appointment to establish a new primary care physician in a few weeks.  The only medications he takes are eye drops.  He denies history of HTN and hyperlipidemia.    Past Medical History:  Diagnosis Date  . Glaucoma     Past Surgical History:  Procedure Laterality Date  . ABDOMINAL AORTIC ENDOVASCULAR STENT GRAFT N/A 09/01/2013   Procedure: ABDOMINAL AORTIC ENDOVASCULAR STENT GRAFT;  Surgeon: Serafina Mitchell, MD;  Location: [facility] OR;  Service: Vascular;  Laterality: N/A;    No Known Allergies  Current Outpatient Medications  Medication Sig Dispense Refill  . latanoprost (XALATAN) 0.005 % ophthalmic solution Place 1 drop into both eyes at bedtime.      No current facility-administered medications for this visit.     No family history on file.  Social History   Socioeconomic History  . Marital status: Married    Spouse name: Not on file  . Number of children: Not on file  . Years of education: Not on file  . Highest education level: Not on file  Occupational History  . Not on file  Social Needs  . Financial resource strain: Not on file  . Food insecurity:    Worry: Not on file    Inability: Not on file  . Transportation needs:    Medical: Not on file    Non-medical: Not on file  Tobacco Use  . Smoking status: Current Every Day Smoker    Packs/day: 0.50     Types: Cigarettes  . Smokeless tobacco: Never Used  Substance and Sexual Activity  . Alcohol use: Yes    Alcohol/week: 0.0 standard drinks    Comment: drinks 3-4 drinks of liquor per day  . Drug use: No  . Sexual activity: Not on file  Lifestyle  . Physical activity:    Days per week: Not on file    Minutes per session: Not on file  . Stress: Not on file  Relationships  . Social connections:    Talks on phone: Not on file    Gets together: Not on file    Attends religious service: Not on file    Active member of club or organization: Not on file    Attends meetings of clubs or organizations: Not on file    Relationship status: Not on file  . Intimate partner violence:    Fear of current or ex partner: Not on file    Emotionally abused: Not on file    Physically abused: Not on file    Forced sexual activity: Not on file  Other Topics Concern  . Not on file  Social History Narrative  . Not on file     REVIEW OF SYSTEMS:   [  X] denotes positive finding, [ ]  denotes negative finding Cardiac  Comments:  Chest pain or chest pressure:    Shortness of breath upon exertion:    Short of breath when lying flat:    Irregular heart rhythm:        Vascular    Pain in calf, thigh, or hip brought on by ambulation:    Pain in feet at night that wakes you up from your sleep:     Blood clot in your veins:    Leg swelling:         Pulmonary    Oxygen at home:    Productive cough:     Wheezing:         Neurologic    Sudden weakness in arms or legs:     Sudden numbness in arms or legs:     Sudden onset of difficulty speaking or slurred speech:    Temporary loss of vision in one eye:     Problems with dizziness:         Gastrointestinal    Blood in stool:     Vomited blood:         Genitourinary    Burning when urinating:     Blood in urine:        Psychiatric    Major depression:         Hematologic    Bleeding problems:    Problems with blood clotting too  easily:        Skin    Rashes or ulcers:        Constitutional    Fever or chills:      PHYSICAL EXAMINATION:    General:  WDWN in NAD; vital signs documented above Gait: Not observed HENT: WNL, normocephalic Pulmonary: normal non-labored breathing , without Rales, rhonchi,  wheezing Cardiac: regular HR, without  Murmurs without carotid bruit Abdomen: soft, NT, no masses Skin: without rashes Vascular Exam/Pulses:  Right Left  Radial 2+ (normal) 2+ (normal)  Ulnar    Femoral 2+ (normal) 2+ (normal)  Popliteal 2+ (normal) 2+ (normal)  DP 2+ (normal) 2+ (normal)  PT 2+ (normal) 2+ (normal)   Extremities: without ischemic changes, without Gangrene , without cellulitis; without open wounds;  Musculoskeletal: no muscle wasting or atrophy  Neurologic: A&O X 3;  No focal weakness or paresthesias are detected Psychiatric:  The pt has Normal affect.   Non-Invasive Vascular Imaging:    Abdominal Aorta Findings: +--------+-------+----------+----------+--------+--------+--------+ LocationAP (cm)Trans (cm)PSV (cm/s)WaveformThrombusComments +--------+-------+----------+----------+--------+--------+--------+ Distal  4.13   4.04      73                                 +--------+-------+----------+----------+--------+--------+--------+  Endovascular Aortic Repair (EVAR): +----------+----------------+-------------------+-------------------+           Diameter AP (cm)Diameter Trans (cm)Velocities (cm/sec) +----------+----------------+-------------------+-------------------+ Aorta     4.13            4.04               73                  +----------+----------------+-------------------+-------------------+ Right Limb1.96            1.96               96                  +----------+----------------+-------------------+-------------------+ Left Limb 1.98  1.87               96                   +----------+----------------+-------------------+-------------------+      IMPRESSION: Patent stent of the abdominal aorta with a amximum diameter of 4.13 x 4.04 cms. Enlarged bilateral iliac limbs.   ASSESSMENT/PLAN:: 68 y.o. male here for follow up for EVAR for symptomatic of 4.5cm AAA in August 2015 and did well.  He has had a persistent endoleak type II but no sac enlargement.   F/u EVAR duplex show a decree in size of the aneurismal sac to 4.1.  He has iliac  He is asymptomatic without lumbar or abdominal pain.  He work odds jobs and has no difficulty with ambulation.    He has an appointment to get established with a new primary care physician in a few weeks.  F/U for repeat EVAR duplex in 1 year.     Roxy Horseman , PA-C [facility] [phone]  Clinic MD:   Carlis Abbott

## 2018-03-02 ENCOUNTER — Encounter: Payer: Self-pay | Admitting: Family

## 2018-03-02 ENCOUNTER — Other Ambulatory Visit: Payer: Self-pay

## 2018-03-02 ENCOUNTER — Ambulatory Visit (HOSPITAL_COMMUNITY)
Admission: RE | Admit: 2018-03-02 | Discharge: 2018-03-02 | Disposition: A | Discharge status: Home / Self Care | Payer: Medicare Other | Source: Ambulatory Visit | Attending: Surgery | Admitting: Surgery

## 2018-03-02 ENCOUNTER — Ambulatory Visit (INDEPENDENT_AMBULATORY_CARE_PROVIDER_SITE_OTHER): Payer: Medicare Other | Admitting: Physician Assistant

## 2018-03-02 VITALS — BP 126/82 | HR 51 | Temp 98.3°F | Resp 12 | Ht 75.0 in | Wt 171.1 lb

## 2018-03-02 DIAGNOSIS — I714 Abdominal aortic aneurysm, without rupture, unspecified: Secondary | ICD-10-CM

## 2018-03-02 DIAGNOSIS — Z95828 Presence of other vascular implants and grafts: Secondary | ICD-10-CM

## 2018-03-10 ENCOUNTER — Encounter: Payer: Self-pay | Admitting: Family Medicine

## 2018-03-10 ENCOUNTER — Other Ambulatory Visit: Payer: Self-pay

## 2018-03-10 ENCOUNTER — Ambulatory Visit (INDEPENDENT_AMBULATORY_CARE_PROVIDER_SITE_OTHER): Payer: Medicare Other | Admitting: Family Medicine

## 2018-03-10 VITALS — BP 152/80 | HR 50 | Temp 98.3°F | Ht 75.0 in | Wt 170.0 lb

## 2018-03-10 DIAGNOSIS — Z0000 Encounter for general adult medical examination without abnormal findings: Secondary | ICD-10-CM | POA: Diagnosis not present

## 2018-03-10 DIAGNOSIS — I714 Abdominal aortic aneurysm, without rupture, unspecified: Secondary | ICD-10-CM

## 2018-03-10 DIAGNOSIS — F17210 Nicotine dependence, cigarettes, uncomplicated: Secondary | ICD-10-CM

## 2018-03-10 DIAGNOSIS — G5712 Meralgia paresthetica, left lower limb: Secondary | ICD-10-CM | POA: Diagnosis not present

## 2018-03-10 DIAGNOSIS — I10 Essential (primary) hypertension: Secondary | ICD-10-CM | POA: Diagnosis not present

## 2018-03-10 DIAGNOSIS — Z114 Encounter for screening for human immunodeficiency virus [HIV]: Secondary | ICD-10-CM

## 2018-03-10 MED ORDER — LOSARTAN POTASSIUM 25 MG PO TABS
25.0000 mg | ORAL_TABLET | Freq: Every day | ORAL | 1 refills | Status: DC
Start: 2018-03-10 — End: 2018-12-02

## 2018-03-10 NOTE — Progress Notes (Signed)
[facility] Phone: [phone]   cc: New patient visit, leg numbness  Subjective:  Patient previously saw Dr. Alroy Dust.  Last visit was 1 year ago.  Currently takes no medication other than eyedrops which are prescribed by Dr. Venetia Maxon.  AAA: In 2015 patient had abdominal pain and went to the emergency room where it was found that he had an abdominal aortic aneurysm.  An EVAR procedure was performed.  He still sees vascular surgery once a year.  Last visit was in January.  They told him everything was fine.  Leg numbness: Patient states he gets numbness in his left leg on the front of his thigh extending down to his knee.  When this happens he takes off his belt or loosens his pants and the numbness goes away.  This has been going on for a few months.  Hot flash: for a few months.  This happens usually when patient goes to bed but not before he falls asleep.  He does not sweat.  He does not get dizzy, his heart rate does not increase.  They usually last 20 minutes and then go away on their own.  Patient states his blood pressure is always been fine and that his doctors previously told him his cholesterol was borderline.  He did not take any statins or antihypertensives.  Patient had a colonoscopy 2 years ago.  He is uncertain if there was any abnormalities  ROS: cough.  All other symptoms negative  Medical History: no   Surgical History: no   FH: sister, died of lung cancer.  Brother with lung cancer., another brother with kidney cancer.  Parents deceased.  Children healthy.    Social History: lives with wife.  They have been married for 31 years.   Work:  Retired.  Apartment maintenance.   Relationship: yes.  Sexually active?: yes Diet: Eats at home mostly: doesn't have a favorite food.  Likes everything and anything.  Drinks mostly water or hi-c.    Exercise: Works out in the yard, out and about.  Does some landscaping work.   Smoking: smoker  (0.5 ppd or  more x 50 yrs).  Cutting back slowly.  Changed brands from [place] to [place]. Other Tobacco: no  Alcohol: 3 days per week. 3 drinks of brandy.   Drug use: N/A   Objective: BP (!) 152/80   Pulse (!) 50   Temp 98.3 F (36.8 C) (Oral)   Ht 6\' 3"  (1.905 m)   Wt 170 lb (77.1 kg)   SpO2 98%   BMI 21.25 kg/m  Body mass index is 21.25 kg/m. Gen: NAD, alert and oriented, cooperative with exam HEENT: NCAT, EOMI, MMM Neck: FROM, supple, no masses no thyromegaly CV: normal rate, regular rhythm. No murmurs, no rubs.  Resp: LCTAB, no wheezes, crackles. normal work of breathing GI: nontender to palpation, BS present, no guarding or organomegaly Msk: No edema, warm, normal tone,  Neuro: CN II-XII grossly intact. no gross deficits Skin: No rashes, no lesions Psych: Appropriate behavior   Assessment/Plan:  Essential hypertension Patient was hypertensive today systolic 673 and then 419 on repeat.  He has multiple other systolic readings noted in his electronic health record in the 150s or 160s.  Given the patient's AAA felt it was important to start antihypertensive medications.  Started out with low dose losartan because there is some evidence that ACE inhibitors and ARB can decrease the size of aneurysms.  Patient has a blood pressure follow-up in  a few weeks at this clinic and will follow up with me in April.  Healthcare maintenance Patient overall in good health.  Was not on any medications prior to today.  Patient gets lots of exercise and maintains a healthy weight.  Patient does smoke but is trying to quit.  Will take lipid panel results and calculate new ASCVD score.  If elevated, we will start him on a statin during a follow-up appointment.  CMP ordered to check for baseline liver function. - Lipid panel ordered - Hepatitis C screen ordered - CMP ordered  Meralgia paresthetica:Patient complains of left thigh numbness resolves when he loosens his pants or belt.  Likely meralgia  paresthetica.  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  Smoking greater than 30 pack years Patient states he currently smokes 1/2 pack/day and has been smoking for over 50 years.  He states he used to smoke more when he was younger.  Likely has at least 30-pack-year history of smoking.  Discussed with patient low-dose CT lung cancer screening and patient was willing to do this.  We will follow-up when we get the results of this.  Patient he states he is also trying to quit.  1 800 quit line information was given to patient and he was informed he can get nicotine replacement therapy from here.  AAA (abdominal aortic aneurysm) Patient continues to follow-up with vascular surgery regarding this issue.  Discussed with patient the importance of maintaining a low blood pressure and the role of statin therapy and helping to prevent future ruptures or other complications.  Clemetine Marker, MD PGY-1

## 2018-03-10 NOTE — Patient Instructions (Addendum)
Your blood pressure was still elevated on recheck. You've had some recorded elevated Blood pressures in the past, so I would like to start a medication on you now.  It's called losartan and I'm putting you on the lowest dose possible.    I have also tested your cholesterol.  If it comes back elevated I will call you and discuss starting a statin.    I have put in an order for a CT screen for lung cancer.  You can get this done at our hospital.    To help quit smoking you can call 1-800- QUIT-NOW.  They have resources and counselors who can talk to you.    The numbness in your leg is from your belt and pants pressing on one of your nerves. 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  The way to fix this is to not wear your belt so tightly.  I'd like to see you back in two weeks to further discuss your results.

## 2018-03-11 DIAGNOSIS — Z0000 Encounter for general adult medical examination without abnormal findings: Secondary | ICD-10-CM | POA: Insufficient documentation

## 2018-03-11 DIAGNOSIS — Z122 Encounter for screening for malignant neoplasm of respiratory organs: Secondary | ICD-10-CM | POA: Insufficient documentation

## 2018-03-11 DIAGNOSIS — F17210 Nicotine dependence, cigarettes, uncomplicated: Secondary | ICD-10-CM | POA: Insufficient documentation

## 2018-03-11 DIAGNOSIS — I10 Essential (primary) hypertension: Secondary | ICD-10-CM | POA: Insufficient documentation

## 2018-03-11 DIAGNOSIS — G5712 Meralgia paresthetica, left lower limb: Secondary | ICD-10-CM | POA: Insufficient documentation

## 2018-03-11 LAB — COMPREHENSIVE METABOLIC PANEL
ALT: 21 IU/L (ref 0–44)
AST: 16 IU/L (ref 0–40)
Albumin/Globulin Ratio: 1.4 (ref 1.2–2.2)
Albumin: 4.2 g/dL (ref 3.8–4.8)
Alkaline Phosphatase: 80 IU/L (ref 39–117)
BUN/Creatinine Ratio: 16 (ref 10–24)
BUN: 14 mg/dL (ref 8–27)
Bilirubin Total: 0.3 mg/dL (ref 0.0–1.2)
CALCIUM: 10.1 mg/dL (ref 8.6–10.2)
CO2: 24 mmol/L (ref 20–29)
Chloride: 102 mmol/L (ref 96–106)
Creatinine, Ser: 0.86 mg/dL (ref 0.76–1.27)
GFR calc Af Amer: 104 mL/min/{1.73_m2} (ref >59)
GFR, EST NON AFRICAN AMERICAN: 90 mL/min/{1.73_m2} (ref >59)
GLOBULIN, TOTAL: 2.9 g/dL (ref 1.5–4.5)
Glucose: 88 mg/dL (ref 65–99)
Potassium: 4.8 mmol/L (ref 3.5–5.2)
Sodium: 139 mmol/L (ref 134–144)
Total Protein: 7.1 g/dL (ref 6.0–8.5)

## 2018-03-11 LAB — HCV COMMENT:

## 2018-03-11 LAB — LIPID PANEL
CHOLESTEROL TOTAL: 196 mg/dL (ref 100–199)
Chol/HDL Ratio: 3.8 ratio (ref 0.0–5.0)
HDL: 51 mg/dL (ref >39)
LDL Calculated: 125 mg/dL — ABNORMAL HIGH (ref 0–99)
Triglycerides: 99 mg/dL (ref 0–149)
VLDL Cholesterol Cal: 20 mg/dL (ref 5–40)

## 2018-03-11 LAB — HIV ANTIBODY (ROUTINE TESTING W REFLEX): HIV SCREEN 4TH GENERATION: NONREACTIVE

## 2018-03-11 LAB — HEPATITIS C ANTIBODY (REFLEX): HCV Ab: <0.1 s/co ratio (ref 0.0–0.9)

## 2018-03-11 NOTE — Assessment & Plan Note (Signed)
Patient complains of left thigh numbness resolves when he loosens his pants or belt.  Likely meralgia paresthetica.  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

## 2018-03-11 NOTE — Assessment & Plan Note (Signed)
Patient was hypertensive today systolic 471 and then 580 on repeat.  He has multiple other systolic readings noted in his electronic health record in the 150s or 160s.  Given the patient's AAA felt it was important to start antihypertensive medications.  Started out with low dose losartan because there is some evidence that ACE inhibitors and ARB can decrease the size of aneurysms.  Patient has a blood pressure follow-up in a few weeks at this clinic and will follow up with me in April.

## 2018-03-11 NOTE — Assessment & Plan Note (Signed)
Patient continues to follow-up with vascular surgery regarding this issue.  Discussed with patient the importance of maintaining a low blood pressure and the role of statin therapy and helping to prevent future ruptures or other complications.

## 2018-03-11 NOTE — Assessment & Plan Note (Addendum)
Patient overall in good health.  Was not on any medications prior to today.  Patient gets lots of exercise and maintains a healthy weight.  Patient does smoke but is trying to quit.  Will take lipid panel results and calculate new ASCVD score.  If elevated, we will start him on a statin during a follow-up appointment.  CMP ordered to check for baseline liver function. - Lipid panel ordered - Hepatitis C screen ordered - CMP ordered

## 2018-03-11 NOTE — Assessment & Plan Note (Signed)
Patient states he currently smokes 1/2 pack/day and has been smoking for over 50 years.  He states he used to smoke more when he was younger.  Likely has at least 30-pack-year history of smoking.  Discussed with patient low-dose CT lung cancer screening and patient was willing to do this.  We will follow-up when we get the results of this.  Patient he states he is also trying to quit.  1 800 quit line information was given to patient and he was informed he can get nicotine replacement therapy from here.

## 2018-03-19 ENCOUNTER — Ambulatory Visit (HOSPITAL_COMMUNITY)
Admission: RE | Admit: 2018-03-19 | Discharge: 2018-03-19 | Disposition: A | Discharge status: Home / Self Care | Payer: Medicare Other | Source: Ambulatory Visit | Attending: Family Medicine | Admitting: Family Medicine

## 2018-03-19 DIAGNOSIS — F17210 Nicotine dependence, cigarettes, uncomplicated: Secondary | ICD-10-CM | POA: Diagnosis not present

## 2018-03-23 ENCOUNTER — Ambulatory Visit (INDEPENDENT_AMBULATORY_CARE_PROVIDER_SITE_OTHER): Payer: Medicare Other | Admitting: Family Medicine

## 2018-03-23 ENCOUNTER — Encounter: Payer: Self-pay | Admitting: Family Medicine

## 2018-03-23 ENCOUNTER — Telehealth: Payer: Self-pay | Admitting: Family Medicine

## 2018-03-23 ENCOUNTER — Other Ambulatory Visit: Payer: Self-pay

## 2018-03-23 VITALS — BP 148/75 | HR 45 | Temp 98.3°F | Wt 170.0 lb

## 2018-03-23 DIAGNOSIS — I10 Essential (primary) hypertension: Secondary | ICD-10-CM | POA: Diagnosis not present

## 2018-03-23 NOTE — Patient Instructions (Addendum)
Thank you for coming to see me today. It was a pleasure. Today we talked about:   Your blood pressure: It's improving.  Keep taking your medicine.  We will get some labs today, if they are abnormal I will call you.  Please follow-up with Dr. Jeannine Kitten on 04/19/2018.  If you have any questions or concerns, please do not hesitate to call the office at [phone].  Best,   [place] Constable, DO   DASH Eating Plan DASH stands for "Dietary Approaches to Stop Hypertension." The DASH eating plan is a healthy eating plan that has been shown to reduce high blood pressure (hypertension). It may also reduce your risk for type 2 diabetes, heart disease, and stroke. The DASH eating plan may also help with weight loss. What are tips for following this plan?  General guidelines  Avoid eating more than 2,300 mg (milligrams) of salt (sodium) a day. If you have hypertension, you may need to reduce your sodium intake to 1,500 mg a day.  Limit alcohol intake to no more than 1 drink a day for nonpregnant women and 2 drinks a day for men. One drink equals 12 oz of beer, 5 oz of wine, or 1 oz of hard liquor.  Work with your health care provider to maintain a healthy body weight or to lose weight. Ask what an ideal weight is for you.  Get at least 30 minutes of exercise that causes your heart to beat faster (aerobic exercise) most days of the week. Activities may include walking, swimming, or biking.  Work with your health care provider or diet and nutrition specialist (dietitian) to adjust your eating plan to your individual calorie needs. Reading food labels   Check food labels for the amount of sodium per serving. Choose foods with less than 5 percent of the Daily Value of sodium. Generally, foods with less than 300 mg of sodium per serving fit into this eating plan.  To find whole grains, look for the word "whole" as the first word in the ingredient list. Shopping  Buy products labeled as  "low-sodium" or "no salt added."  Buy fresh foods. Avoid canned foods and premade or frozen meals. Cooking  Avoid adding salt when cooking. Use salt-free seasonings or herbs instead of table salt or sea salt. Check with your health care provider or pharmacist before using salt substitutes.  Do not fry foods. Cook foods using healthy methods such as baking, boiling, grilling, and broiling instead.  Cook with heart-healthy oils, such as olive, canola, soybean, or sunflower oil. Meal planning  Eat a balanced diet that includes: ? 5 or more servings of fruits and vegetables each day. At each meal, try to fill half of your plate with fruits and vegetables. ? Up to 6-8 servings of whole grains each day. ? Less than 6 oz of lean meat, poultry, or fish each day. A 3-oz serving of meat is about the same size as a deck of cards. One egg equals 1 oz. ? 2 servings of low-fat dairy each day. ? A serving of nuts, seeds, or beans 5 times each week. ? Heart-healthy fats. Healthy fats called Omega-3 fatty acids are found in foods such as flaxseeds and coldwater fish, like sardines, salmon, and mackerel.  Limit how much you eat of the following: ? Canned or prepackaged foods. ? Food that is high in trans fat, such as fried foods. ? Food that is high in saturated fat, such as fatty meat. ? Sweets, desserts, sugary  drinks, and other foods with added sugar. ? Full-fat dairy products.  Do not salt foods before eating.  Try to eat at least 2 vegetarian meals each week.  Eat more home-cooked food and less restaurant, buffet, and fast food.  When eating at a restaurant, ask that your food be prepared with less salt or no salt, if possible. What foods are recommended? The items listed may not be a complete list. Talk with your dietitian about what dietary choices are best for you. Grains Whole-grain or whole-wheat bread. Whole-grain or whole-wheat pasta. Brown rice. [place]. Bulgur. Whole-grain  and low-sodium cereals. Pita bread. Low-fat, low-sodium crackers. Whole-wheat flour tortillas. Vegetables Fresh or frozen vegetables (raw, steamed, roasted, or grilled). Low-sodium or reduced-sodium tomato and vegetable juice. Low-sodium or reduced-sodium tomato sauce and tomato paste. Low-sodium or reduced-sodium canned vegetables. Fruits All fresh, dried, or frozen fruit. Canned fruit in natural juice (without added sugar). Meat and other protein foods Skinless chicken or [place]. Ground chicken or [place]. Pork with fat trimmed off. Fish and seafood. Egg whites. Dried beans, peas, or lentils. Unsalted nuts, nut butters, and seeds. Unsalted canned beans. Lean cuts of beef with fat trimmed off. Low-sodium, lean deli meat. Dairy Low-fat (1%) or fat-free (skim) milk. Fat-free, low-fat, or reduced-fat cheeses. Nonfat, low-sodium ricotta or cottage cheese. Low-fat or nonfat yogurt. Low-fat, low-sodium cheese. Fats and oils Soft margarine without trans fats. Vegetable oil. Low-fat, reduced-fat, or light mayonnaise and salad dressings (reduced-sodium). Canola, safflower, olive, soybean, and sunflower oils. Avocado. Seasoning and other foods Herbs. Spices. Seasoning mixes without salt. Unsalted popcorn and pretzels. Fat-free sweets. What foods are not recommended? The items listed may not be a complete list. Talk with your dietitian about what dietary choices are best for you. Grains Baked goods made with fat, such as croissants, muffins, or some breads. Dry pasta or rice meal packs. Vegetables Creamed or fried vegetables. Vegetables in a cheese sauce. Regular canned vegetables (not low-sodium or reduced-sodium). Regular canned tomato sauce and paste (not low-sodium or reduced-sodium). Regular tomato and vegetable juice (not low-sodium or reduced-sodium). Angie Fava. Olives. Fruits Canned fruit in a light or heavy syrup. Fried fruit. Fruit in cream or butter sauce. Meat and other protein foods Fatty cuts  of meat. Ribs. Fried meat. Berniece Salines. Sausage. Bologna and other processed lunch meats. Salami. Fatback. Hotdogs. Bratwurst. Salted nuts and seeds. Canned beans with added salt. Canned or smoked fish. Whole eggs or egg yolks. Chicken or [place] with skin. Dairy Whole or 2% milk, cream, and half-and-half. Whole or full-fat cream cheese. Whole-fat or sweetened yogurt. Full-fat cheese. Nondairy creamers. Whipped toppings. Processed cheese and cheese spreads. Fats and oils Butter. Stick margarine. Lard. Shortening. Ghee. Bacon fat. Tropical oils, such as coconut, palm kernel, or palm oil. Seasoning and other foods Salted popcorn and pretzels. Onion salt, garlic salt, seasoned salt, table salt, and sea salt. Worcestershire sauce. Tartar sauce. Barbecue sauce. Teriyaki sauce. Soy sauce, including reduced-sodium. Steak sauce. Canned and packaged gravies. Fish sauce. Oyster sauce. Cocktail sauce. Horseradish that you find on the shelf. Ketchup. Mustard. Meat flavorings and tenderizers. Bouillon cubes. Hot sauce and Tabasco sauce. Premade or packaged marinades. Premade or packaged taco seasonings. Relishes. Regular salad dressings. Where to find more information:  National Heart, Lung, and [place]: https://wilson-eaton.com/  American Heart Association: www.heart.org Summary  The DASH eating plan is a healthy eating plan that has been shown to reduce high blood pressure (hypertension). It may also reduce your risk for type 2 diabetes, heart disease, and  stroke.  With the DASH eating plan, you should limit salt (sodium) intake to 2,300 mg a day. If you have hypertension, you may need to reduce your sodium intake to 1,500 mg a day.  When on the DASH eating plan, aim to eat more fresh fruits and vegetables, whole grains, lean proteins, low-fat dairy, and heart-healthy fats.  Work with your health care provider or diet and nutrition specialist (dietitian) to adjust your eating plan to your individual calorie  needs. This information is not intended to replace advice given to you by your health care provider. Make sure you discuss any questions you have with your health care provider. Document Released: 12/19/2010 Document Revised: 12/24/2015 Document Reviewed: 12/24/2015 Elsevier Interactive Patient Education  2019 [employer].

## 2018-03-23 NOTE — Assessment & Plan Note (Signed)
BP mildly improved from last visit.  BP today 148/75.  Patient tolerating losartan well at this time.  Will recheck BMP to assess for changes in K and creatinine.  Creatinine on 2/26 was 0.86, K was 4.8.  Patient is scheduled for an appointment in 1 month with his PCP.  Should recheck blood pressure at that time as well and consider increasing dose of not at goal at that time. -Continue losartan 25 mg nightly -BMP today

## 2018-03-23 NOTE — Progress Notes (Signed)
     Subjective: Chief Complaint  Patient presents with  . Hypertension     HPI: Duane Delgado is a 68 y.o. presenting to clinic today to discuss the following:  1 Hypertension Recheck Patient presents to clinic for blood pressure recheck.  He was started on losartan 25 mg on 2/26 by his PCP.  Patient states that he has been taking his medicine every evening without missing a dose.  He does not have a blood pressure cuff at home and therefore has not been checking his blood pressure.  He states that he eats a "everything diet."  He also states that he does not exercise, but is very active during his day.  He does not have any complaints at this time.  He denies chest pain and shortness of breath.   ROS noted in HPI.   Past Medical, Surgical, Social, and Family History Reviewed & Updated per EMR.   Pertinent Historical Findings include:   Social History   Tobacco Use  Smoking Status Current Every Day Smoker  . Packs/day: 0.50  . Types: Cigarettes  Smokeless Tobacco Never Used      Objective: BP (!) 148/75   Pulse (!) 45   Temp 98.3 F (36.8 C) (Oral)   Wt 170 lb (77.1 kg)   SpO2 98%   BMI 21.25 kg/m  Vitals and nursing notes reviewed  Physical Exam: General: 68 y.o. male in NAD Cardio: RRR no m/r/g Lungs: CTAB, no wheezing, no rhonchi, no crackles Skin: warm and dry Extremities: No edema   No results found for this or any previous visit (from the past 72 hour(s)).  Assessment/Plan:  Essential hypertension BP mildly improved from last visit.  BP today 148/75.  Patient tolerating losartan well at this time.  Will recheck BMP to assess for changes in K and creatinine.  Creatinine on 2/26 was 0.86, K was 4.8.  Patient is scheduled for an appointment in 1 month with his PCP.  Should recheck blood pressure at that time as well and consider increasing dose of not at goal at that time. -Continue losartan 25 mg nightly -BMP today     PATIENT EDUCATION  PROVIDED: See AVS    Diagnosis and plan along with any newly prescribed medication(s) were discussed in detail with this patient today. The patient verbalized understanding and agreed with the plan. Patient advised if symptoms worsen return to clinic or ER.   Health Maintainance:   Orders Placed This Encounter  Procedures  . Basic Metabolic Panel    No orders of the defined types were placed in this encounter.    [place] Constable, DO 03/23/2018, 8:55 AM PGY-1 [place]

## 2018-03-23 NOTE — Telephone Encounter (Signed)
LVM for patient that CT cancer screening was benign but that he still needs to repeat test in one year. Told him we could discuss it more at our next visit.

## 2018-03-24 ENCOUNTER — Encounter: Payer: Self-pay | Admitting: Family Medicine

## 2018-03-24 LAB — BASIC METABOLIC PANEL
BUN / CREAT RATIO: 19 (ref 10–24)
BUN: 14 mg/dL (ref 8–27)
CO2: 20 mmol/L (ref 20–29)
Calcium: 9.8 mg/dL (ref 8.6–10.2)
Chloride: 105 mmol/L (ref 96–106)
Creatinine, Ser: 0.73 mg/dL — ABNORMAL LOW (ref 0.76–1.27)
GFR, EST AFRICAN AMERICAN: 112 mL/min/{1.73_m2} (ref >59)
GFR, EST NON AFRICAN AMERICAN: 97 mL/min/{1.73_m2} (ref >59)
Glucose: 86 mg/dL (ref 65–99)
Potassium: 4.4 mmol/L (ref 3.5–5.2)
Sodium: 141 mmol/L (ref 134–144)

## 2018-03-24 NOTE — Progress Notes (Signed)
K and Cr stable since start of Losartan.  Letter sent to patient.

## 2018-04-19 ENCOUNTER — Ambulatory Visit: Payer: Medicare Other | Admitting: Family Medicine

## 2018-07-13 ENCOUNTER — Other Ambulatory Visit: Payer: Self-pay

## 2018-07-13 ENCOUNTER — Telehealth (INDEPENDENT_AMBULATORY_CARE_PROVIDER_SITE_OTHER): Payer: Medicare Other | Admitting: Family Medicine

## 2018-07-13 DIAGNOSIS — Z20828 Contact with and (suspected) exposure to other viral communicable diseases: Secondary | ICD-10-CM | POA: Diagnosis not present

## 2018-07-13 DIAGNOSIS — Z20822 Contact with and (suspected) exposure to covid-19: Secondary | ICD-10-CM | POA: Insufficient documentation

## 2018-07-13 NOTE — Progress Notes (Signed)
[place] Telemedicine Visit  Patient consented to have virtual visit. Method of visit: Telephone  Encounter participants: Patient: Duane Delgado - located at Home  Provider: Patriciaann Clan - located at [facility]  Others (if applicable): None   Chief Complaint: COVID exposure   HPI: Duane Delgado is a 68 year old gentleman presenting via telemedicine to discuss the following:  Request for testing/COVID exposure: States on Saturday, 6/27, he was in close proximity with a friend for several hours who just was with another friend that tested COVID positive in the last few days. Patient is currently asymptomatic, denies any fever, shortness of breath, coughing, rhinorrhea, GI symptoms, fatigue.  No change in smell or taste.  He lives with his wife who is also with this friend on Saturday, she just got tested for COVID today.  He is retired.  Does have a history of hypertension, long-term smoking history, AAA.  Friend who they were with on Saturday is also asymptomatic at this time.  Patient is requesting to have COVID testing.   ROS: per HPI  Pertinent PMHx: Hypertension, tobacco use, abdominal aortic aneurysm  Exam:  Respiratory: Unlabored breathing, speaking in full sentences without concern, no coughing during encounter  Assessment/Plan:  Close Exposure to Covid-19 Virus Patient requesting testing due to close proximity to friend who is recent with a COVID positive patient for several hours.  Asymptomatic.  - Will have patient schedule for COVID testing (through community pool) - Discussed COVID signs and symptoms to closely monitor for at home - Follow-up pending COVID results, however suspect this will likely be negative    Time spent during visit with patient: 5 minutes  [place] PGY-1

## 2018-07-13 NOTE — Assessment & Plan Note (Addendum)
Patient requesting testing due to close proximity to friend who is recent with a COVID positive patient for several hours.  Asymptomatic.  - Will have patient schedule for COVID testing (through community pool) - Discussed COVID signs and symptoms to closely monitor for at home - Follow-up pending COVID results, however suspect this will likely be negative

## 2018-07-14 ENCOUNTER — Telehealth: Payer: Self-pay | Admitting: *Deleted

## 2018-07-14 ENCOUNTER — Other Ambulatory Visit: Payer: Self-pay

## 2018-07-14 DIAGNOSIS — Z20822 Contact with and (suspected) exposure to covid-19: Secondary | ICD-10-CM

## 2018-07-14 DIAGNOSIS — R6889 Other general symptoms and signs: Secondary | ICD-10-CM | POA: Diagnosis not present

## 2018-07-14 NOTE — Telephone Encounter (Signed)
-----   Message from Patriciaann Clan, DO sent at 07/13/2018  3:42 PM EDT ----- Regarding: Request COVID testing Hello,   I would like to have COVID testing for this patient with concerns for COVID exposure on 6/27. Asymptomatic.   Thank you,  Dr Higinio Plan

## 2018-07-14 NOTE — Telephone Encounter (Signed)
Pt scheduled today for covid testing @ [facility] @ 1:30. Instructions given and order placed

## 2018-07-21 LAB — NOVEL CORONAVIRUS, NAA: SARS-CoV-2, NAA: NOT DETECTED

## 2018-07-22 ENCOUNTER — Telehealth: Payer: Self-pay

## 2018-07-22 NOTE — Telephone Encounter (Signed)
Pt calling to get COVID test result. Please call [phone]. Ottis Stain, CMA

## 2018-07-22 NOTE — Telephone Encounter (Signed)
Informed patient that his test results were negative.  He reports he is asymptomatic.

## 2018-10-05 ENCOUNTER — Other Ambulatory Visit: Payer: Self-pay

## 2018-10-05 DIAGNOSIS — R6889 Other general symptoms and signs: Secondary | ICD-10-CM | POA: Diagnosis not present

## 2018-10-05 DIAGNOSIS — Z20822 Contact with and (suspected) exposure to covid-19: Secondary | ICD-10-CM

## 2018-10-06 LAB — NOVEL CORONAVIRUS, NAA: SARS-CoV-2, NAA: DETECTED — AB

## 2018-12-02 ENCOUNTER — Other Ambulatory Visit: Payer: Self-pay

## 2018-12-02 DIAGNOSIS — Z0000 Encounter for general adult medical examination without abnormal findings: Secondary | ICD-10-CM

## 2018-12-02 MED ORDER — LOSARTAN POTASSIUM 25 MG PO TABS: 25.0000 mg | ORAL_TABLET | Freq: Every day | ORAL | 1 refills | Status: DC

## 2018-12-02 NOTE — Telephone Encounter (Signed)
Can we call this patient to see if he would like to see me in clinic for his HTN? I'm refilling his script regardless.

## 2018-12-02 NOTE — Telephone Encounter (Signed)
LVM to call office to see about scheduling pt an appointment.Latrelle Bazar Zimmerman Rumple, CMA

## 2018-12-07 DIAGNOSIS — H402232 Chronic angle-closure glaucoma, bilateral, moderate stage: Secondary | ICD-10-CM | POA: Diagnosis not present

## 2018-12-07 DIAGNOSIS — H401133 Primary open-angle glaucoma, bilateral, severe stage: Secondary | ICD-10-CM | POA: Diagnosis not present

## 2018-12-07 DIAGNOSIS — H2513 Age-related nuclear cataract, bilateral: Secondary | ICD-10-CM | POA: Diagnosis not present

## 2018-12-14 NOTE — Telephone Encounter (Signed)
Contacted pt and appointment scheduled.Bruk Tumolo Zimmerman Rumple, CMA  

## 2018-12-21 ENCOUNTER — Other Ambulatory Visit: Payer: Self-pay

## 2018-12-21 ENCOUNTER — Ambulatory Visit (INDEPENDENT_AMBULATORY_CARE_PROVIDER_SITE_OTHER): Payer: Medicare Other | Admitting: Family Medicine

## 2018-12-21 VITALS — BP 165/90 | HR 51 | Wt 172.2 lb

## 2018-12-21 DIAGNOSIS — Z0000 Encounter for general adult medical examination without abnormal findings: Secondary | ICD-10-CM | POA: Diagnosis not present

## 2018-12-21 DIAGNOSIS — I10 Essential (primary) hypertension: Secondary | ICD-10-CM | POA: Diagnosis not present

## 2018-12-21 DIAGNOSIS — G5712 Meralgia paresthetica, left lower limb: Secondary | ICD-10-CM | POA: Diagnosis not present

## 2018-12-21 DIAGNOSIS — F17210 Nicotine dependence, cigarettes, uncomplicated: Secondary | ICD-10-CM | POA: Diagnosis not present

## 2018-12-21 MED ORDER — LOSARTAN POTASSIUM 50 MG PO TABS: 50.0000 mg | ORAL_TABLET | Freq: Every day | ORAL | 0 refills | Status: DC

## 2018-12-21 NOTE — Progress Notes (Signed)
   [facility] Phone: [phone]     Duane Delgado - 69 y.o. male MRN [id number]  Date of birth: 22-Aug-1950  Subjective:   cc: Hypertension,  HPI:  HTN: Patient states he takes his medicine nightly before bed.  Misses maybe 2 doses a week.  No cough, no swelling of the lips.  Patient denies any other symptoms.  No abdominal pain.  Health maintenance: Patient declines the flu shot.  He is hesitant to get the pneumonia vaccine, because he is concerned about side effects.  Discussed with patient that side effects are rare and he cannot get the virus from these vaccinations.  Patient wants to think about the Pneumovax vaccine for a little while longer and will possibly get it at his nurse visit.  Patient says he got the colonoscopy from [facility] 2 to 3 years ago.  Smoking cessation.:  Patient smokes approximately slightly less than 1 pack a day.  Has been trying to cut back.  Has tried nicotine patch previously with some success, but did not like the taste of the gum.  Patient is willing to try again with nicotine patch.  Meralgia paresthetica-patient states the numbness in his left leg has improved greatly since he is started wearing his belt looser.  ROS: See HPI for pertinent positives and negatives  Family history reviewed for today's visit. No changes.  Social history- patient is a current smoker   Objective:   BP (!) 165/90   Pulse (!) 51   Wt 172 lb 3.2 oz (78.1 kg)   SpO2 99%   BMI 21.52 kg/m  Gen: Alert and oriented.  No acute distress. HEENT: No scleral icterus CV: Regular rate and rhythm.  No murmurs. Resp: Lungs clear to auscultation bilaterally.  No wheezes or crackles. GI: Soft, nontender to palpation.  Assessment/Plan:   Essential hypertension Elevated again today.  Patient does not want to take a second medication.  Patient willing to increase his dose from 25 to 50 mg nightly.  Encourage patient to take his medicine every night.   Advised patient what to look for for side effects, especially swelling of the lips or tongue.  We will have him follow-up in nurse visit in 2 weeks to get BMP and blood pressure recheck.  Healthcare maintenance Patient signed form for request of information for colonoscopy from [facility].  Patient declining flu vaccine, but willing to consider pneumonia vaccine.  Wants to think about some more.  Advised patient he can get this vaccine at his nurse visit in 2 weeks.  Encourage patient to get this vaccine due to his smoking status.  Smoking greater than 30 pack years Patient willing to try smoking cessation again.  Does not like the taste of the gum.  Sent in prescription to was is [facility] outpatient pharmacy for prescription for nicotine patch 14 mg and nicotine lozenge 2 mg.  Informed patient smoking cessation is important given his hypertension and AAA.  Meralgia paraesthetica, left Symptoms much improved since he has started wearing his belt looser.  No further work-up or intervention needed.   Clemetine Marker, MD PGY-2 [facility]

## 2018-12-21 NOTE — Assessment & Plan Note (Signed)
Patient willing to try smoking cessation again.  Does not like the taste of the gum.  Sent in prescription to was is [facility] outpatient pharmacy for prescription for nicotine patch 14 mg and nicotine lozenge 2 mg.  Informed patient smoking cessation is important given his hypertension and AAA.

## 2018-12-21 NOTE — Patient Instructions (Addendum)
It was nice to see you again  I have increased your losartan from 25 mg to 50 mg.  I would like you to come back for a nurse visit/lab visit in 2 weeks to get your lab work and blood pressure checked.  At this time I would also encourage you to get your pneumonia vaccine.  I have sent you a prescription for nicotine patch and lozenge.  Wear the patch during the day and take it off at night before going to sleep.  You can put the lozenge in her mouth if you are having any intense cravings while on the patch.  It is okay to use both.  Have a great day,  Clemetine Marker, MD

## 2018-12-21 NOTE — Assessment & Plan Note (Signed)
Elevated again today.  Patient does not want to take a second medication.  Patient willing to increase his dose from 25 to 50 mg nightly.  Encourage patient to take his medicine every night.  Advised patient what to look for for side effects, especially swelling of the lips or tongue.  We will have him follow-up in nurse visit in 2 weeks to get BMP and blood pressure recheck.

## 2018-12-21 NOTE — Assessment & Plan Note (Signed)
Symptoms much improved since he has started wearing his belt looser.  No further work-up or intervention needed.

## 2018-12-21 NOTE — Assessment & Plan Note (Signed)
Patient signed form for request of information for colonoscopy from [facility].  Patient declining flu vaccine, but willing to consider pneumonia vaccine.  Wants to think about some more.  Advised patient he can get this vaccine at his nurse visit in 2 weeks.  Encourage patient to get this vaccine due to his smoking status.

## 2018-12-23 ENCOUNTER — Encounter: Payer: Self-pay | Admitting: Family Medicine

## 2019-01-11 ENCOUNTER — Other Ambulatory Visit: Payer: Self-pay

## 2019-01-11 ENCOUNTER — Other Ambulatory Visit: Payer: Medicare Other

## 2019-01-11 ENCOUNTER — Ambulatory Visit: Payer: Medicare Other

## 2019-01-11 DIAGNOSIS — I10 Essential (primary) hypertension: Secondary | ICD-10-CM

## 2019-01-12 LAB — BASIC METABOLIC PANEL
BUN/Creatinine Ratio: 22 (ref 10–24)
BUN: 18 mg/dL (ref 8–27)
CO2: 23 mmol/L (ref 20–29)
Calcium: 10.3 mg/dL — ABNORMAL HIGH (ref 8.6–10.2)
Chloride: 101 mmol/L (ref 96–106)
Creatinine, Ser: 0.82 mg/dL (ref 0.76–1.27)
GFR calc Af Amer: 106 mL/min/{1.73_m2} (ref >59)
GFR calc non Af Amer: 92 mL/min/{1.73_m2} (ref >59)
Glucose: 131 mg/dL — ABNORMAL HIGH (ref 65–99)
Potassium: 3.9 mmol/L (ref 3.5–5.2)
Sodium: 138 mmol/L (ref 134–144)

## 2019-02-25 DIAGNOSIS — H401133 Primary open-angle glaucoma, bilateral, severe stage: Secondary | ICD-10-CM | POA: Diagnosis not present

## 2019-03-10 ENCOUNTER — Other Ambulatory Visit: Payer: Self-pay

## 2019-03-10 DIAGNOSIS — I714 Abdominal aortic aneurysm, without rupture, unspecified: Secondary | ICD-10-CM

## 2019-03-11 ENCOUNTER — Ambulatory Visit (HOSPITAL_COMMUNITY)
Admission: RE | Admit: 2019-03-11 | Discharge: 2019-03-11 | Disposition: A | Discharge status: Home / Self Care | Payer: Medicare Other | Source: Ambulatory Visit | Attending: Surgery | Admitting: Surgery

## 2019-03-11 ENCOUNTER — Other Ambulatory Visit: Payer: Self-pay

## 2019-03-11 ENCOUNTER — Ambulatory Visit (INDEPENDENT_AMBULATORY_CARE_PROVIDER_SITE_OTHER): Payer: Medicare Other | Admitting: Physician Assistant

## 2019-03-11 VITALS — BP 125/79 | HR 45 | Temp 97.5°F | Resp 18 | Ht 75.0 in | Wt 166.0 lb

## 2019-03-11 DIAGNOSIS — I714 Abdominal aortic aneurysm, without rupture, unspecified: Secondary | ICD-10-CM

## 2019-03-11 DIAGNOSIS — F17210 Nicotine dependence, cigarettes, uncomplicated: Secondary | ICD-10-CM | POA: Diagnosis not present

## 2019-03-11 NOTE — Progress Notes (Signed)
    Established EVAR   History of Present Illness   Duane Delgado is a 69 y.o. (May 12, 1950) male who presents for routine follow up s/p EVAR by Dr. Trula Slade (Date: 08/2013).  He denies any new or changing abdominal or back pain.  He also denies any claudication, rest pain, or tissue discoloration of bilateral lower extremities.  He is taking a baby aspirin daily.  He is smoking a little over half a pack a day down from 1 pack a day.  The patient's PMH, PSH, SH, and FamHx were reviewed and are unchanged from prior visit  Current Outpatient Medications  Medication Sig Dispense Refill  . latanoprost (XALATAN) 0.005 % ophthalmic solution Place 1 drop into both eyes at bedtime.     Marland Kitchen losartan (COZAAR) 50 MG tablet Take 1 tablet (50 mg total) by mouth daily. 90 tablet 0   No current facility-administered medications for this visit.    On ROS today: 10 system ROS negative unless otherwise noted in HPI   Physical Examination   Vitals:   03/11/19 0929  BP: 125/79  Pulse: (!) 45  Resp: 18  Temp: (!) 97.5 F (36.4 C)  TempSrc: Temporal  SpO2: 100%  Weight: 166 lb (75.3 kg)  Height: 6\' 3"  (1.905 m)   Body mass index is 20.75 kg/m.  General Alert, O x 3, WD, NAD  Pulmonary Sym exp, good B air movt,   Cardiac RRR, Nl S1, S2  Vascular Vessel Right Left  Radial Palpable Palpable  DP Palpable Palpable    Gastro- intestinal soft, non-distended, non-tender to palpation,   Musculo- skeletal M/S 5/5 throughout  , Extremities without ischemic changes  , No edema present, No visible varicosities ,  Neurologic A&O    Non-Invasive Vascular Imaging   EVAR Duplex   AAA sac size: 4.5 cm at largest diameter; increased from 4.1cm  no endoleak detected   Medical Decision Making   Duane Delgado is a 69 y.o. male who presents over 5 years s/p EVAR   Minimal increase in AAA sac size however no endoleaks by duplex  Recheck EVAR duplex in 1 year  Continue aspirin  Encouraged  smoking cessation   Duane Ligas PA-C [facility] Office: [phone]  Clinic MD: Duane Delgado

## 2019-03-14 ENCOUNTER — Other Ambulatory Visit: Payer: Self-pay | Admitting: *Deleted

## 2019-03-14 DIAGNOSIS — Z95828 Presence of other vascular implants and grafts: Secondary | ICD-10-CM

## 2019-03-22 DIAGNOSIS — H401133 Primary open-angle glaucoma, bilateral, severe stage: Secondary | ICD-10-CM | POA: Diagnosis not present

## 2019-04-29 DIAGNOSIS — H401133 Primary open-angle glaucoma, bilateral, severe stage: Secondary | ICD-10-CM | POA: Diagnosis not present

## 2019-04-29 DIAGNOSIS — H2513 Age-related nuclear cataract, bilateral: Secondary | ICD-10-CM | POA: Diagnosis not present

## 2019-05-19 DIAGNOSIS — H401133 Primary open-angle glaucoma, bilateral, severe stage: Secondary | ICD-10-CM | POA: Diagnosis not present

## 2019-06-17 ENCOUNTER — Other Ambulatory Visit: Payer: Self-pay | Admitting: *Deleted

## 2019-06-17 DIAGNOSIS — Z0000 Encounter for general adult medical examination without abnormal findings: Secondary | ICD-10-CM

## 2019-06-17 MED ORDER — LOSARTAN POTASSIUM 50 MG PO TABS: 50.0000 mg | ORAL_TABLET | Freq: Every day | ORAL | 0 refills | Status: DC

## 2019-08-23 DIAGNOSIS — H401133 Primary open-angle glaucoma, bilateral, severe stage: Secondary | ICD-10-CM | POA: Diagnosis not present

## 2019-11-30 DIAGNOSIS — H4010X0 Unspecified open-angle glaucoma, stage unspecified: Secondary | ICD-10-CM | POA: Insufficient documentation

## 2019-11-30 NOTE — Progress Notes (Signed)
    SUBJECTIVE:   CHIEF COMPLAINT / HPI:  Decreased appetite:  Pt eating one meal per day.  He believes it is due tot hte fact that he only has three teeth and chewing can be difficult/painful.  He does not have dentures.  He is switching to a medicare advantage plan tha will allow him to get dentures.  Pt states he is not losing weight.  Able to tolerate liquids wiell.     Smoking: pt still smokes, but now down to less than one half pack per day.  He tried a friend's chantix but it turned his nose black so he stopped.  He would be willing to try nicotine replacement.     PERTINENT  PMH / PSH: smoking  OBJECTIVE:   BP 130/70   Pulse 99   Ht 6\' 3"  (1.905 m)   Wt 162 lb 9.6 oz (73.8 kg)   SpO2 (!) 45%   BMI 20.32 kg/m   Gen: alert oriented.  Tall thin male, no acute distress.  CV: RRR Pulm: LCTAB  ASSESSMENT/PLAN:   Decreased appetite No significant weight loss.  I think the patient is correct that this is likely due to his difficulties chewing due to his lack of teeth.  Should improved aftr pt gets dentures.  Does not appear cachectic, not concerned for malignancy at this time. had a low dose lung CT scan last year that was negative.   Smoking greater than 30 pack years Pt tried chantix that belonged to a friend.  States it caused his nose to become hyperpigmented.  I'm not aware of this side effect for this medication.  Will send in infro on quit smoking hotline.  Encouraged pt to quit smoking completely.      Benay Pike, MD [place]

## 2019-12-01 ENCOUNTER — Ambulatory Visit (INDEPENDENT_AMBULATORY_CARE_PROVIDER_SITE_OTHER): Payer: Medicare Other | Admitting: Family Medicine

## 2019-12-01 ENCOUNTER — Other Ambulatory Visit: Payer: Self-pay

## 2019-12-01 ENCOUNTER — Encounter: Payer: Self-pay | Admitting: Family Medicine

## 2019-12-01 VITALS — BP 130/70 | HR 99 | Ht 75.0 in | Wt 162.6 lb

## 2019-12-01 DIAGNOSIS — R202 Paresthesia of skin: Secondary | ICD-10-CM

## 2019-12-01 DIAGNOSIS — R630 Anorexia: Secondary | ICD-10-CM

## 2019-12-01 DIAGNOSIS — F17210 Nicotine dependence, cigarettes, uncomplicated: Secondary | ICD-10-CM

## 2019-12-01 DIAGNOSIS — H401193 Primary open-angle glaucoma, unspecified eye, severe stage: Secondary | ICD-10-CM | POA: Diagnosis not present

## 2019-12-01 NOTE — Patient Instructions (Signed)
It was nice to see you again today,  It sounds like the reason you are not eating as much as you usually do is due to the issues you have with chewing from lack of teeth.  I think getting dentures will help you the most when it comes to having a better appetite.  I am going to get some blood work on you today to make sure your protein levels are sufficient.  If you are having stomach discomfort, you can use Pepcid or Tums as needed.  If you would like to quit smoking and would like some free nicotine replacement therapy, you can contact the smoking cessation hotline at 1-800-quit-now.  Please schedule another appointment with me for sometime after you have received your dentures.  Have a great day,  Clemetine Marker, MD

## 2019-12-02 LAB — COMPREHENSIVE METABOLIC PANEL
ALT: 16 IU/L (ref 0–44)
AST: 16 IU/L (ref 0–40)
Albumin/Globulin Ratio: 1.5 (ref 1.2–2.2)
Albumin: 4.3 g/dL (ref 3.8–4.8)
Alkaline Phosphatase: 72 IU/L (ref 44–121)
BUN/Creatinine Ratio: 16 (ref 10–24)
BUN: 14 mg/dL (ref 8–27)
Bilirubin Total: 0.2 mg/dL (ref 0.0–1.2)
CO2: 24 mmol/L (ref 20–29)
Calcium: 10.4 mg/dL — ABNORMAL HIGH (ref 8.6–10.2)
Chloride: 104 mmol/L (ref 96–106)
Creatinine, Ser: 0.87 mg/dL (ref 0.76–1.27)
GFR calc Af Amer: 103 mL/min/{1.73_m2} (ref >59)
GFR calc non Af Amer: 89 mL/min/{1.73_m2} (ref >59)
Globulin, Total: 2.9 g/dL (ref 1.5–4.5)
Glucose: 88 mg/dL (ref 65–99)
Potassium: 4.3 mmol/L (ref 3.5–5.2)
Sodium: 144 mmol/L (ref 134–144)
Total Protein: 7.2 g/dL (ref 6.0–8.5)

## 2019-12-02 LAB — CBC
Hematocrit: 41.9 % (ref 37.5–51.0)
Hemoglobin: 14.4 g/dL (ref 13.0–17.7)
MCH: 30.1 pg (ref 26.6–33.0)
MCHC: 34.4 g/dL (ref 31.5–35.7)
MCV: 88 fL (ref 79–97)
Platelets: 198 10*3/uL (ref 150–450)
RBC: 4.79 x10E6/uL (ref 4.14–5.80)
RDW: 13.5 % (ref 11.6–15.4)
WBC: 5.5 10*3/uL (ref 3.4–10.8)

## 2019-12-02 LAB — VITAMIN B12: Vitamin B-12: 538 pg/mL (ref 232–1245)

## 2019-12-04 DIAGNOSIS — R630 Anorexia: Secondary | ICD-10-CM | POA: Insufficient documentation

## 2019-12-04 NOTE — Assessment & Plan Note (Signed)
No significant weight loss.  I think the patient is correct that this is likely due to his difficulties chewing due to his lack of teeth.  Should improved aftr pt gets dentures.  Does not appear cachectic, not concerned for malignancy at this time. had a low dose lung CT scan last year that was negative.

## 2019-12-04 NOTE — Assessment & Plan Note (Signed)
Pt tried chantix that belonged to a friend.  States it caused his nose to become hyperpigmented.  I'm not aware of this side effect for this medication.  Will send in infro on quit smoking hotline.  Encouraged pt to quit smoking completely.

## 2019-12-06 ENCOUNTER — Other Ambulatory Visit: Payer: Self-pay | Admitting: Family Medicine

## 2019-12-06 NOTE — Progress Notes (Signed)
Called pt and left voicemail.  Mildly elevated Ca.  Advised pt to come in for lab visit for PTH and ionized Ca at pt's earliest convenience.

## 2019-12-12 ENCOUNTER — Other Ambulatory Visit: Payer: Self-pay | Admitting: Family Medicine

## 2019-12-12 DIAGNOSIS — Z0000 Encounter for general adult medical examination without abnormal findings: Secondary | ICD-10-CM

## 2020-01-09 IMAGING — CT CT CHEST LUNG CANCER SCREENING LOW DOSE
3 of 5 series · 13 of 40 positions shown, 14 images · non-contrast
Comparison: None.

CLINICAL DATA: Current smoker, 25 pack-year history.

EXAM:
CT CHEST WITHOUT CONTRAST LOW-DOSE FOR LUNG CANCER SCREENING
TECHNIQUE: Multidetector CT imaging of the chest was performed following the
standard protocol without IV contrast.

[Series 3: thorax 5.0 i31f 1 · axial · 0.76mm/px · z∈[-272,-157]mm · 2 of 70 slices shown, 3 images]
[im 24/70  mediastinal]
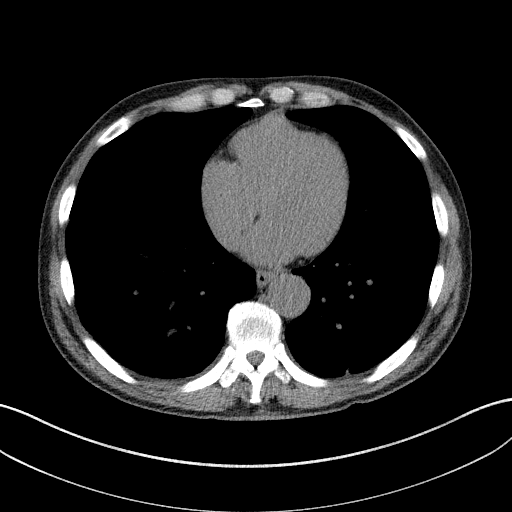
[im 24/70  lung]
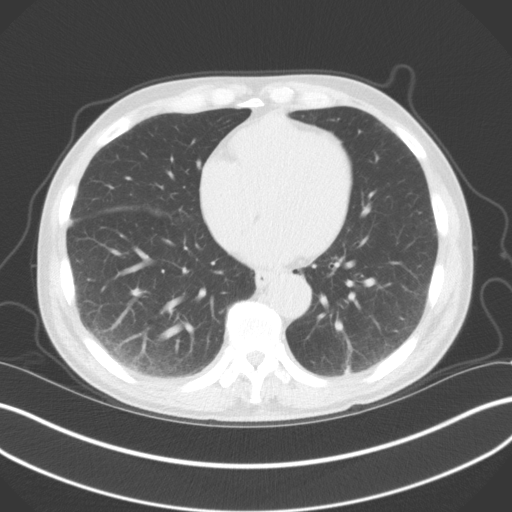
[im 47/70  lung]
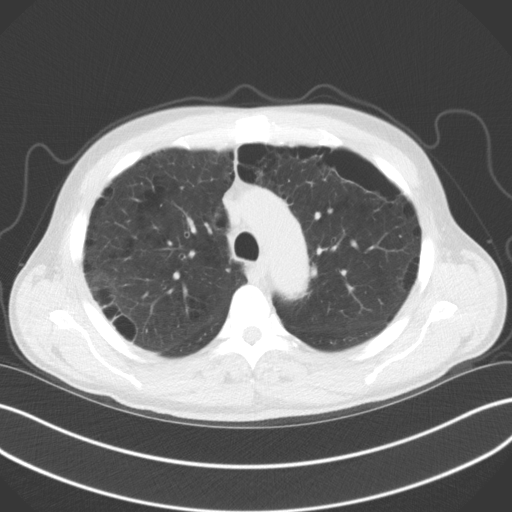

[Series 4: lung thins 1.0 · axial · 0.76mm/px · z∈[-318,-71]mm · 8 of 305 slices shown]
[im 29/305  lung]
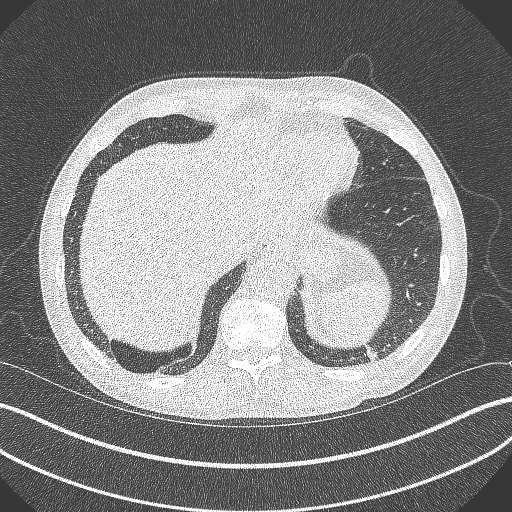
[im 58/305  lung]
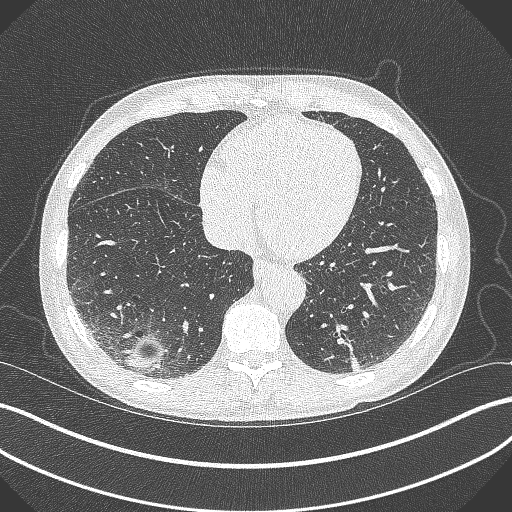
[im 102/305  lung]
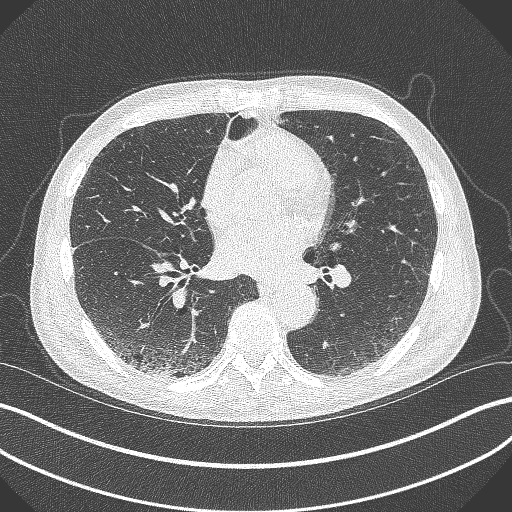
[im 131/305  lung]
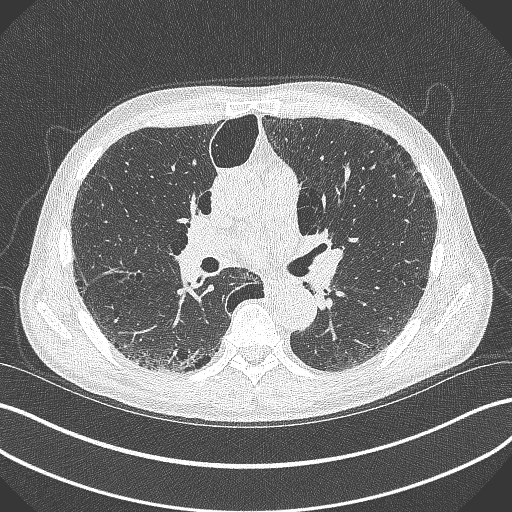
[im 174/305  lung]
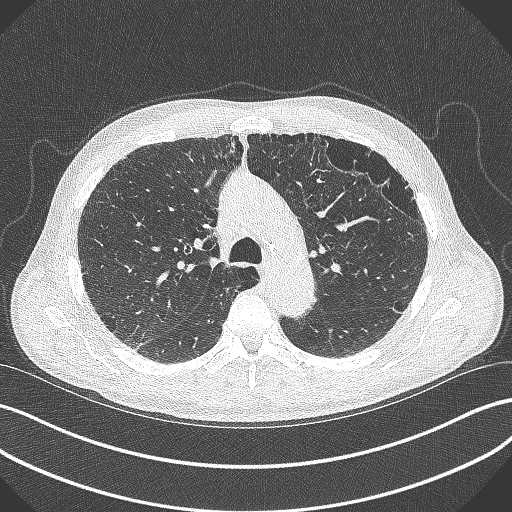
[im 203/305  lung]
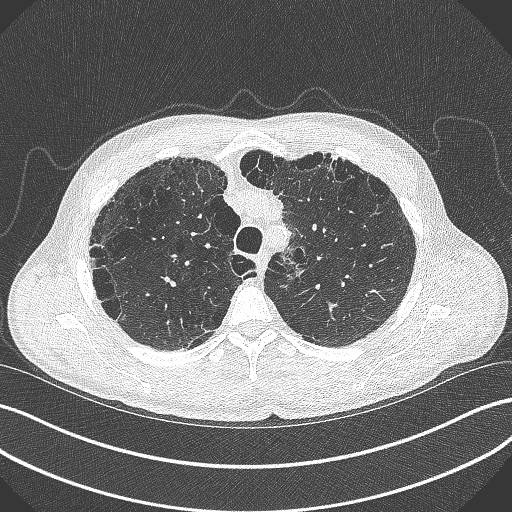
[im 247/305  lung]
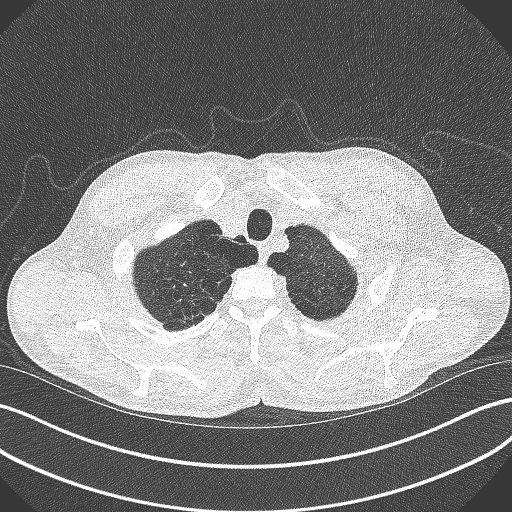
[im 276/305  lung]
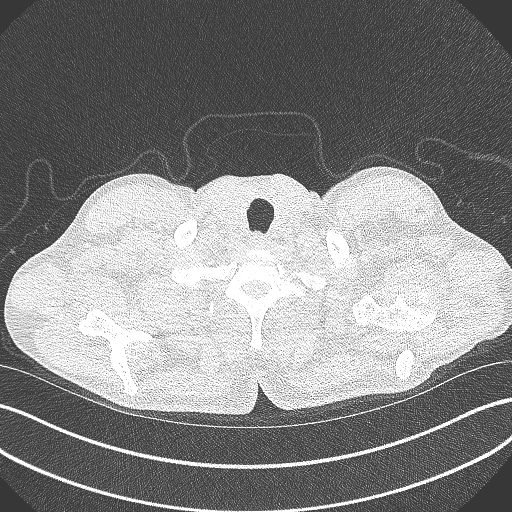

[Series 6: coronal · coronal · 0.69mm/px · 3 of 147 slices shown]
[im 30/147  lung]
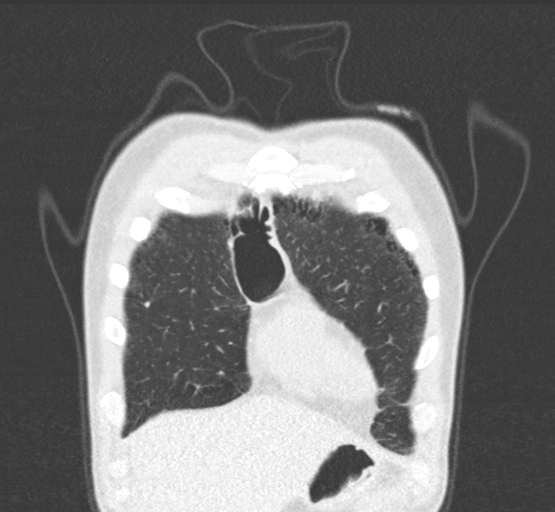
[im 59/147  lung]
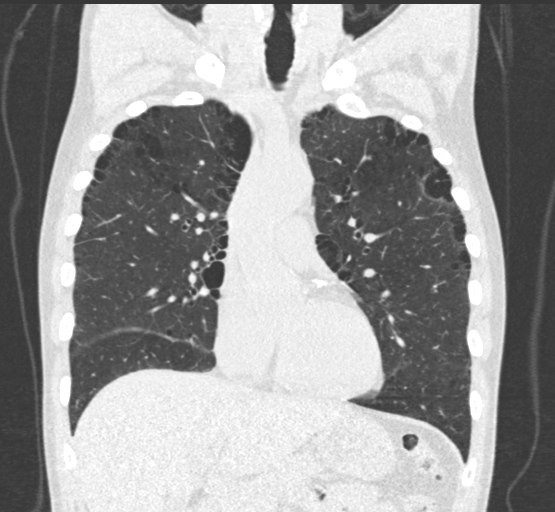
[im 88/147  lung]
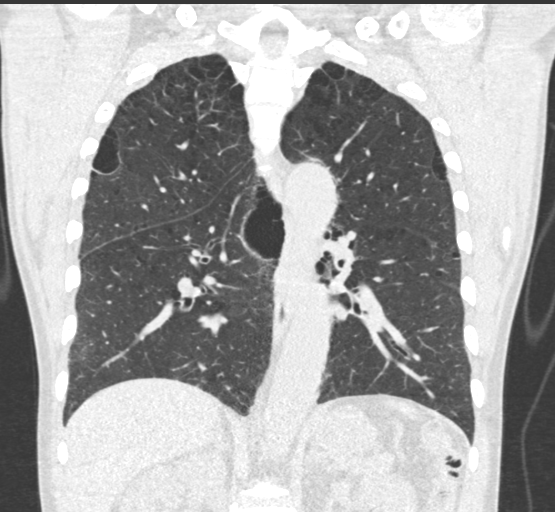

[13 of 40 positions shown; findings below may reference images not displayed]

FINDINGS: Cardiovascular: Atherosclerotic calcification of the aorta and
coronary arteries. Heart size normal. No pericardial effusion.

Mediastinum/Nodes: No pathologically enlarged mediastinal or
axillary lymph nodes. Hilar regions are difficult to evaluate
without IV contrast but appear grossly unremarkable. Esophagus is
grossly unremarkable.

Lungs/Pleura: Centrilobular and paraseptal emphysema. Pulmonary
nodules measure 5.7 mm or less in size. No pleural fluid. Airway is
unremarkable.

Upper Abdomen: Visualized portions of the liver and adrenal glands
are unremarkable. Low-attenuation lesions in the right kidney
measure up to 1.4 cm and are too small to characterize. Visualized
portions of the kidneys, spleen, pancreas, stomach and bowel are
otherwise unremarkable. No upper abdominal adenopathy.

Musculoskeletal: No worrisome lytic or sclerotic lesions.
IMPRESSION: 1. Lung-RADS 2, benign appearance or behavior. Continue annual
screening with low-dose chest CT without contrast in 12 months.
2. Aortic atherosclerosis (6B39T-170.0). Coronary artery
calcification.
3.  Emphysema (6B39T-T4Y.5).

## 2020-01-26 NOTE — Progress Notes (Signed)
    SUBJECTIVE:   CHIEF COMPLAINT / HPI:   Appetite: Patient is still waiting on insurance change to get his dentures.  Appetite has remained the same, but still has difficulty chewing.  Smoking: Patient still smoking cigarettes.  Did not like the taste of the Nicorette gum.  Willing to try patch or lozenge.  States the hyperpigmentation on his nose that he associates with Chantix use is improving.  PERTINENT  PMH / PSH: Smoking  OBJECTIVE:   BP 130/68   Pulse (!) 49   Ht 6\' 3"  (1.905 m)   Wt 164 lb 12.8 oz (74.8 kg)   SpO2 98%   BMI 20.60 kg/m   General: Alert and oriented.  Tall, thin male patient.  Appears stated age Neck: No thyromegaly or thyroid nodules CV: Regular rate and rhythm Pulmonary: Lungs clear to auscultation bilaterally   ASSESSMENT/PLAN:   Hypercalcemia Two previous most recent CMP's showed slightly elevated calcium level.  We will repeat CMP today and get ionized calcium and PTH to rule out hyperparathyroidism.  We will follow-up with results.  Smoking greater than 30 pack years Encourage patient to continue efforts to quit smoking.  Recommended lozenges or patch.  Patient states he may try these.     Benay Pike, MD [place]

## 2020-01-27 ENCOUNTER — Ambulatory Visit (INDEPENDENT_AMBULATORY_CARE_PROVIDER_SITE_OTHER): Payer: Medicare Other | Admitting: Family Medicine

## 2020-01-27 ENCOUNTER — Other Ambulatory Visit: Payer: Self-pay

## 2020-01-27 ENCOUNTER — Encounter: Payer: Self-pay | Admitting: Family Medicine

## 2020-01-27 DIAGNOSIS — F17210 Nicotine dependence, cigarettes, uncomplicated: Secondary | ICD-10-CM | POA: Diagnosis not present

## 2020-01-27 NOTE — Patient Instructions (Signed)
It was nice to see you again today,  I will get some blood test to further evaluate the cause of your high calcium levels.  I will call you with results when I get them.  I would strongly encourage you to continue attempting to quit smoking.  You can use nicotine patches or lozenges if you do not like the taste of the gum.  We will continue to watch your balance issues but they are likely due to increasing age.  We will follow-up at her next visit.  Have a great day,  Clemetine Marker, MD

## 2020-01-29 NOTE — Assessment & Plan Note (Signed)
Encourage patient to continue efforts to quit smoking.  Recommended lozenges or patch.  Patient states he may try these.

## 2020-01-29 NOTE — Assessment & Plan Note (Signed)
Two previous most recent CMP's showed slightly elevated calcium level.  We will repeat CMP today and get ionized calcium and PTH to rule out hyperparathyroidism.  We will follow-up with results.

## 2020-02-02 LAB — PTH, INTACT AND CALCIUM

## 2020-02-02 LAB — COMPREHENSIVE METABOLIC PANEL
ALT: 23 IU/L (ref 0–44)
AST: 15 IU/L (ref 0–40)
Albumin/Globulin Ratio: 1.3 (ref 1.2–2.2)
Albumin: 4.3 g/dL (ref 3.8–4.8)
Alkaline Phosphatase: 77 IU/L (ref 44–121)
BUN/Creatinine Ratio: 17 (ref 10–24)
BUN: 16 mg/dL (ref 8–27)
Bilirubin Total: <0.2 mg/dL (ref 0.0–1.2)
CO2: 20 mmol/L (ref 20–29)
Calcium: 9.9 mg/dL (ref 8.6–10.2)
Chloride: 107 mmol/L — ABNORMAL HIGH (ref 96–106)
Creatinine, Ser: 0.92 mg/dL (ref 0.76–1.27)
GFR calc Af Amer: 98 mL/min/{1.73_m2} (ref >59)
GFR calc non Af Amer: 85 mL/min/{1.73_m2} (ref >59)
Globulin, Total: 3.3 g/dL (ref 1.5–4.5)
Glucose: 97 mg/dL (ref 65–99)
Potassium: 4.6 mmol/L (ref 3.5–5.2)
Sodium: 144 mmol/L (ref 134–144)
Total Protein: 7.6 g/dL (ref 6.0–8.5)

## 2020-02-02 LAB — CALCIUM, IONIZED: Calcium, Ion: 5.2 mg/dL (ref 4.5–5.6)

## 2020-03-12 ENCOUNTER — Ambulatory Visit: Payer: Medicare Other | Admitting: Physician Assistant

## 2020-03-12 ENCOUNTER — Ambulatory Visit (HOSPITAL_COMMUNITY)
Admission: RE | Admit: 2020-03-12 | Discharge: 2020-03-12 | Disposition: A | Discharge status: Home / Self Care | Payer: Medicare Other | Source: Ambulatory Visit | Attending: Surgery | Admitting: Surgery

## 2020-03-12 ENCOUNTER — Other Ambulatory Visit: Payer: Self-pay

## 2020-03-12 VITALS — BP 183/86 | HR 41 | Temp 98.3°F | Resp 20 | Ht 75.0 in | Wt 165.1 lb

## 2020-03-12 DIAGNOSIS — Z95828 Presence of other vascular implants and grafts: Secondary | ICD-10-CM

## 2020-03-12 NOTE — Progress Notes (Signed)
Office Note     CC:  follow up Requesting Provider:  Benay Pike, MD  HPI: Duane Delgado is a 70 y.o. (1950-10-20) male who presents for routine follow-up of EVAR on September 01, 2013 by Dr. Trula Slade.  At his last visit 1 year ago, he was doing well and the maximal diameter of his aorta was 4.5 cm with no evidence of endoleak.  He denies abdominal or back pain. No claudication or rest pain. Continues to smoke. Says he was never on aspirin therapy and choose not to take statin.  Past Medical History:  Diagnosis Date  . Glaucoma     Past Surgical History:  Procedure Laterality Date  . ABDOMINAL AORTIC ENDOVASCULAR STENT GRAFT N/A 09/01/2013   Procedure: ABDOMINAL AORTIC ENDOVASCULAR STENT GRAFT;  Surgeon: Serafina Mitchell, MD;  Location: [facility];  Service: Vascular;  Laterality: N/A;    Social History   Socioeconomic History  . Marital status: Married    Spouse name: Not on file  . Number of children: Not on file  . Years of education: Not on file  . Highest education level: Not on file  Occupational History  . Not on file  Tobacco Use  . Smoking status: Current Every Day Smoker    Packs/day: 0.50    Types: Cigarettes  . Smokeless tobacco: Never Used  Substance and Sexual Activity  . Alcohol use: Yes    Alcohol/week: 0.0 standard drinks    Comment: drinks 3-4 drinks of liquor per day  . Drug use: No  . Sexual activity: Not on file  Other Topics Concern  . Not on file  Social History Narrative  . Not on file   Social Determinants of Health   Financial Resource Strain: Not on file  Food Insecurity: Not on file  Transportation Needs: Not on file  Physical Activity: Not on file  Stress: Not on file  Social Connections: Not on file  Intimate Partner Violence: Not on file   No family history on file.  Current Outpatient Medications  Medication Sig Dispense Refill  . ketorolac (ACULAR) 0.5 % ophthalmic solution SMARTSIG:In Eye(s)    . latanoprost (XALATAN)  0.005 % ophthalmic solution Place 1 drop into both eyes at bedtime.     Marland Kitchen losartan (COZAAR) 50 MG tablet TAKE 1 TABLET(50 MG) BY MOUTH DAILY 90 tablet 0   No current facility-administered medications for this visit.    No Known Allergies   REVIEW OF SYSTEMS:   [X]  denotes positive finding, [ ]  denotes negative finding Cardiac  Comments:  Chest pain or chest pressure:    Shortness of breath upon exertion:    Short of breath when lying flat:    Irregular heart rhythm:        Vascular    Pain in calf, thigh, or hip brought on by ambulation:    Pain in feet at night that wakes you up from your sleep:     Blood clot in your veins:    Leg swelling:         Pulmonary    Oxygen at home:    Productive cough:     Wheezing:         Neurologic    Sudden weakness in arms or legs:     Sudden numbness in arms or legs:     Sudden onset of difficulty speaking or slurred speech:    Temporary loss of vision in one eye:     Problems with dizziness:  Gastrointestinal    Blood in stool:     Vomited blood:         Genitourinary    Burning when urinating:     Blood in urine:        Psychiatric    Major depression:         Hematologic    Bleeding problems:    Problems with blood clotting too easily:        Skin    Rashes or ulcers:        Constitutional    Fever or chills:      PHYSICAL EXAMINATION:  Vitals:   03/12/20 0938  BP: (!) 183/86  Pulse: (!) 41  Resp: 20  Temp: 98.3 F (36.8 C)  TempSrc: Temporal  SpO2: 100%  Weight: 165 lb 1.6 oz (74.9 kg)  Height: 6\' 3"  (1.905 m)    General:  WDWN in NAD; vital signs documented above Gait: Unaided; no ataxia HENT: WNL, normocephalic Pulmonary: normal non-labored breathing , without Rales, rhonchi,  wheezing Cardiac: regular HR, without  Murmurs without carotid bruit Abdomen: soft, NT, no masses Skin: without rashes Vascular Exam/Pulses: 2+ bilateral radial, femoral, popliteal and right DP and PT palpable  pulses. Left DP is 1+ Extremities: without ischemic changes, without Gangrene , without cellulitis; without open wounds;  Musculoskeletal: no muscle wasting or atrophy  Neurologic: A&O X 3;  No focal weakness or paresthesias are detected Psychiatric:  The pt has Normal affect.   Non-Invasive Vascular Imaging:   Abdominal Aorta: Patent endovascular aneurysm repair with no evidence of  endoleak, however, there are areas of limited visualization due to  overlying bowel gas. The largest aortic diameter of 4.5 cm remains  essentially unchanged compared to prior exam.  Previous diameter measurement was 4.5 cm obtained on 03/11/2019.    ASSESSMENT/PLAN:: 70 y.o. male here for follow up for EVAR without change in maximal diameter and no evidence of endo leaks.  Encouraged smoking cessation. Recheck in one year with duplex or sooner should he have concerns  Barbie Banner, PA-C [facility] [phone]  Clinic MD:   Trula Slade

## 2020-05-16 ENCOUNTER — Other Ambulatory Visit: Payer: Self-pay | Admitting: Family Medicine

## 2020-05-16 ENCOUNTER — Ambulatory Visit (INDEPENDENT_AMBULATORY_CARE_PROVIDER_SITE_OTHER): Payer: Medicare Other

## 2020-05-16 ENCOUNTER — Other Ambulatory Visit: Payer: Self-pay

## 2020-05-16 VITALS — BP 152/78 | HR 55 | Ht 75.0 in | Wt 158.0 lb

## 2020-05-16 DIAGNOSIS — Z0000 Encounter for general adult medical examination without abnormal findings: Secondary | ICD-10-CM

## 2020-05-16 NOTE — Progress Notes (Addendum)
Subjective:   Duane Delgado is a 70 y.o. male who presents for Medicare Annual/Subsequent preventive examination.  Review of Systems: Defer to PCP.  Cardiac Risk Factors include: advanced age (>81men, >53 women)  Objective:    Vitals: BP (!) 152/78   Pulse (!) 55   Ht 6\' 3"  (1.905 m)   Wt 158 lb (71.7 kg)   SpO2 98%   BMI 19.75 kg/m   Body mass index is 19.75 kg/m.  Advanced Directives 05/16/2020 03/23/2018 03/10/2018 07/28/2016 05/26/2014 09/01/2013  Does Patient Have a Medical Advance Directive? No No No No No No  Would patient like information on creating a medical advance directive? Yes (MAU/Ambulatory/Procedural Areas - Information given) No - Patient declined No - Patient declined - Yes - Educational materials given No - patient declined information   Tobacco Social History   Tobacco Use  Smoking Status Current Every Day Smoker  . Packs/day: 0.50  . Types: Cigarettes  Smokeless Tobacco Never Used     Ready to quit: No Counseling given: Yes  Clinical Intake:  Pre-visit preparation completed: Yes  Pain Score: 0-No pain  How often do you need to have someone help you when you read instructions, pamphlets, or other written materials from your doctor or pharmacy?: 2 - Rarely What is the last grade level you completed in school?: High School  Interpreter Needed?: No  Past Medical History:  Diagnosis Date  . Glaucoma   . Hypertension    Past Surgical History:  Procedure Laterality Date  . ABDOMINAL AORTIC ENDOVASCULAR STENT GRAFT N/A 09/01/2013   Procedure: ABDOMINAL AORTIC ENDOVASCULAR STENT GRAFT;  Surgeon: Serafina Mitchell, MD;  [facility]: [facility] OR;  Service: Vascular;  Laterality: N/A;   Family History  Problem Relation Age of Onset  . Cancer Sister    Social History   Socioeconomic History  . Marital status: Married    Spouse name: Roland Rack  . Number of children: 2  . Years of education: 20  . Highest education level: Not on file  Occupational  History  . Not on file  Tobacco Use  . Smoking status: Current Every Day Smoker    Packs/day: 0.50    Types: Cigarettes  . Smokeless tobacco: Never Used  Vaping Use  . Vaping Use: Never used  Substance and Sexual Activity  . Alcohol use: Yes    Alcohol/week: 0.0 standard drinks    Comment: drinks 3-4 drinks of liquor per day  . Drug use: No  . Sexual activity: Yes  Other Topics Concern  . Not on file  Social History Narrative   Patient lives in [place] with his wife.    Patient has 2 sons and 6 grandchildren, they live locally.    Patient is still working for a tree company 4x a week.   Patients exercise comes outdoor work.   Patient enjoys fishing, playing horseshoes, and seeing family and friends.    Social Determinants of Health   Financial Resource Strain: Low Risk   . Difficulty of Paying Living Expenses: Not hard at all  Food Insecurity: No Food Insecurity  . Worried About Charity fundraiser in the Last Year: Never true  . Ran Out of Food in the Last Year: Never true  Transportation Needs: No Transportation Needs  . Lack of Transportation (Medical): No  . Lack of Transportation (Non-Medical): No  Physical Activity: Sufficiently Active  . Days of Exercise per Week: 4 days  . Minutes of Exercise per Session: 60 min  Stress: No Stress Concern Present  . Feeling of Stress : Not at all  Social Connections: Moderately Integrated  . Frequency of Communication with Friends and Family: More than three times a week  . Frequency of Social Gatherings with Friends and Family: More than three times a week  . Attends Religious Services: More than 4 times per year  . Active Member of Clubs or Organizations: No  . Attends Archivist Meetings: Never  . Marital Status: Married   Outpatient Encounter Medications as of 05/16/2020  Medication Sig  . ketorolac (ACULAR) 0.5 % ophthalmic solution SMARTSIG:In Eye(s)  . latanoprost (XALATAN) 0.005 % ophthalmic solution Place  1 drop into both eyes at bedtime.   . [DISCONTINUED] losartan (COZAAR) 50 MG tablet TAKE 1 TABLET(50 MG) BY MOUTH DAILY   No facility-administered encounter medications on file as of 05/16/2020.   Activities of Daily Living In your present state of health, do you have any difficulty performing the following activities: 05/16/2020  Hearing? N  Vision? N  Difficulty concentrating or making decisions? N  Walking or climbing stairs? N  Dressing or bathing? N  Doing errands, shopping? N  Preparing Food and eating ? N  Using the Toilet? N  In the past six months, have you accidently leaked urine? N  Do you have problems with loss of bowel control? N  Managing your Medications? N  Managing your Finances? N  Housekeeping or managing your Housekeeping? N  Some recent data might be hidden   Patient Care Team: Benay Pike, MD as PCP - General (Family Medicine)   Assessment:   This is a routine wellness examination for Duane Delgado.  Exercise Activities and Dietary recommendations Current Exercise Habits: The patient has a physically strenuous job, but has no regular exercise apart from work., Exercise limited by: respiratory conditions(s)  Goals    . Quit Smoking      Fall Risk Fall Risk  05/16/2020 12/21/2018 03/23/2018  Falls in the past year? 0 0 0  Follow up Falls prevention discussed - -   Is the patient's home free of loose throw rugs in walkways, pet beds, electrical cords, etc?   yes      Grab bars in the bathroom? yes      Handrails on the stairs?   yes      Adequate lighting?   yes  Patient rating of health (0-10): 9  Depression Screen PHQ 2/9 Scores 05/16/2020 05/16/2020 01/27/2020 12/01/2019  PHQ - 2 Score 0 0 0 0  PHQ- 9 Score - - 0 0   Cognitive Function  6CIT Screen 05/16/2020  What Year? 0 points  What month? 0 points  What time? 0 points  Count back from 20 0 points  Months in reverse 0 points  Repeat phrase 0 points  Total Score 0   Screening Tests Health  Maintenance  Topic Date Due  . COVID-19 Vaccine (1) Never done  . TETANUS/TDAP  Never done  . PNA vac Low Risk Adult (1 of 2 - PCV13) Never done  . INFLUENZA VACCINE  08/13/2020  . COLONOSCOPY (Pts 45-85yrs Insurance coverage will need to be confirmed)  06/27/2021  . Hepatitis C Screening  Completed  . HPV VACCINES  Aged Out   Cancer Screenings: Lung: Low Dose CT Chest recommended if Age 36-80 years, 30 pack-year currently smoking OR have quit w/in 15years. Patient does qualify. 03/22/2018 WNL Patient due for FU screening.  Colorectal: UTD  Additional Screenings: Hepatitis C Screening:  Completed  HIV Screening: Completed    Plan:  PCP apt scheduled for 5/10 @10 :30am. Fill out the advance directive packet I gave you.  Consider Covid Vaccine. You are due for FU lung cancer screen.  I have personally reviewed and noted the following in the patient's chart:   . Medical and social history . Use of alcohol, tobacco or illicit drugs  . Current medications and supplements . Functional ability and status . Nutritional status . Physical activity . Advanced directives . List of other physicians . Hospitalizations, surgeries, and ER visits in previous 12 months . Vitals . Screenings to include cognitive, depression, and falls . Referrals and appointments  In addition, I have reviewed and discussed with patient certain preventive protocols, quality metrics, and best practice recommendations. A written personalized care plan for preventive services as well as general preventive health recommendations were provided to patient.  Dorna Bloom, [place]  05/16/2020   I have reviewed this visit and agree with the documentation.  Addison Naegeli, MD PGY-3 [place]

## 2020-05-16 NOTE — Patient Instructions (Addendum)
You spoke to Duane Delgado, [name] for your annual wellness visit.  We discussed goals: Goals    . Quit Smoking      We also discussed recommended health maintenance. As discussed, you are due for the following. PCP apt scheduled for 5/10 @10 :30am.  Health Maintenance  Topic Date Due  . COVID-19 Vaccine (1) Never done  . TETANUS/TDAP  Never done  . PNA vac Low Risk Adult (1 of 2 - PCV13) Never done  . INFLUENZA VACCINE  08/13/2020  . COLONOSCOPY (Pts 45-57yrs Insurance coverage will need to be confirmed)  06/27/2021  . Hepatitis C Screening  Completed  . HPV VACCINES  Aged Out   PCP apt scheduled for 5/10 @10 :30am. Fill out the advance directive packet I gave you.  Consider Covid Vaccine. You are due for FU lung cancer screen.  We also discussed smoking cessation.  1-800-QUIT-NOW  Preventive Care 44 Years and Older, Male Preventive care refers to lifestyle choices and visits with your health care provider that can promote health and wellness. This includes:  A yearly physical exam. This is also called an annual wellness visit.  Regular dental and eye exams.  Immunizations.  Screening for certain conditions.  Healthy lifestyle choices, such as: ? Eating a healthy diet. ? Getting regular exercise. ? Not using drugs or products that contain nicotine and tobacco. ? Limiting alcohol use. What can I expect for my preventive care visit? Physical exam Your health care provider will check your:  Height and weight. These may be used to calculate your BMI (body mass index). BMI is a measurement that tells if you are at a healthy weight.  Heart rate and blood pressure.  Body temperature.  Skin for abnormal spots. Counseling Your health care provider may ask you questions about your:  Past medical problems.  Family's medical history.  Alcohol, tobacco, and drug use.  Emotional well-being.  Home life and relationship well-being.  Sexual activity.  Diet, exercise,  and sleep habits.  History of falls.  Memory and ability to understand (cognition).  Work and work Statistician.  Access to firearms. What immunizations do I need? Vaccines are usually given at various ages, according to a schedule. Your health care provider will recommend vaccines for you based on your age, medical history, and lifestyle or other factors, such as travel or where you work.   What tests do I need? Blood tests  Lipid and cholesterol levels. These may be checked every 5 years, or more often depending on your overall health.  Hepatitis C test.  Hepatitis B test. Screening  Lung cancer screening. You may have this screening every year starting at age 36 if you have a 30-pack-year history of smoking and currently smoke or have quit within the past 15 years.  Colorectal cancer screening. ? All adults should have this screening starting at age 81 and continuing until age 7. ? Your health care provider may recommend screening at age 38 if you are at increased risk. ? You will have tests every 1-10 years, depending on your results and the type of screening test.  Prostate cancer screening. Recommendations will vary depending on your family history and other risks.  Genital exam to check for testicular cancer or hernias.  Diabetes screening. ? This is done by checking your blood sugar (glucose) after you have not eaten for a while (fasting). ? You may have this done every 1-3 years.  Abdominal aortic aneurysm (AAA) screening. You may need this if you  are a current or former smoker.  STD (sexually transmitted disease) testing, if you are at risk. Follow these instructions at home: Eating and drinking  Eat a diet that includes fresh fruits and vegetables, whole grains, lean protein, and low-fat dairy products. Limit your intake of foods with high amounts of sugar, saturated fats, and salt.  Take vitamin and mineral supplements as recommended by your health care  provider.  Do not drink alcohol if your health care provider tells you not to drink.  If you drink alcohol: ? Limit how much you have to 0-2 drinks a day. ? Be aware of how much alcohol is in your drink. In the [place] one drink equals one 12 oz bottle of beer (355 mL), one 5 oz glass of wine (148 mL), or one 1 oz glass of hard liquor (44 mL).   Lifestyle  Take daily care of your teeth and gums. Brush your teeth every morning and night with fluoride toothpaste. Floss one time each day.  Stay active. Exercise for at least 30 minutes 5 or more days each week.  Do not use any products that contain nicotine or tobacco, such as cigarettes, e-cigarettes, and chewing tobacco. If you need help quitting, ask your health care provider.  Do not use drugs.  If you are sexually active, practice safe sex. Use a condom or other form of protection to prevent STIs (sexually transmitted infections).  Talk with your health care provider about taking a low-dose aspirin or statin.  Find healthy ways to cope with stress, such as: ? Meditation, yoga, or listening to music. ? Journaling. ? Talking to a trusted person. ? Spending time with friends and family. Safety  Always wear your seat belt while driving or riding in a vehicle.  Do not drive: ? If you have been drinking alcohol. Do not ride with someone who has been drinking. ? When you are tired or distracted. ? While texting.  Wear a helmet and other protective equipment during sports activities.  If you have firearms in your house, make sure you follow all gun safety procedures. What's next?  Visit your health care provider once a year for an annual wellness visit.  Ask your health care provider how often you should have your eyes and teeth checked.  Stay up to date on all vaccines. This information is not intended to replace advice given to you by your health care provider. Make sure you discuss any questions you have with your health care  provider. Document Revised: 09/28/2018 Document Reviewed: 12/24/2017 Elsevier Patient Education  2021 [place] Your Hypertension Hypertension, also called high blood pressure, is when the force of the blood pressing against the walls of the arteries is too strong. Arteries are blood vessels that carry blood from your heart throughout your body. Hypertension forces the heart to work harder to pump blood and may cause the arteries to become narrow or stiff. Understanding blood pressure readings Your personal target blood pressure may vary depending on your medical conditions, your age, and other factors. A blood pressure reading includes a higher number over a lower number. Ideally, your blood pressure should be below 120/80. You should know that:  The first, or top, number is called the systolic pressure. It is a measure of the pressure in your arteries as your heart beats.  The second, or bottom number, is called the diastolic pressure. It is a measure of the pressure in your arteries as the heart relaxes. Blood pressure  is classified into four stages. Based on your blood pressure reading, your health care provider may use the following stages to determine what type of treatment you need, if any. Systolic pressure and diastolic pressure are measured in a unit called mmHg. Normal  Systolic pressure: below 992.  Diastolic pressure: below 80. Elevated  Systolic pressure: 426-834.  Diastolic pressure: below 80. Hypertension stage 1  Systolic pressure: 196-222.  Diastolic pressure: 97-98. Hypertension stage 2  Systolic pressure: 921 or above.  Diastolic pressure: 90 or above. How can this condition affect me? Managing your hypertension is an important responsibility. Over time, hypertension can damage the arteries and decrease blood flow to important parts of the body, including the brain, heart, and kidneys. Having untreated or uncontrolled hypertension can lead to:  A  heart attack.  A stroke.  A weakened blood vessel (aneurysm).  Heart failure.  Kidney damage.  Eye damage.  Metabolic syndrome.  Memory and concentration problems.  Vascular dementia. What actions can I take to manage this condition? Hypertension can be managed by making lifestyle changes and possibly by taking medicines. Your health care provider will help you make a plan to bring your blood pressure within a normal range. Nutrition  Eat a diet that is high in fiber and potassium, and low in salt (sodium), added sugar, and fat. An example eating plan is called the Dietary Approaches to Stop Hypertension (DASH) diet. To eat this way: ? Eat plenty of fresh fruits and vegetables. Try to fill one-half of your plate at each meal with fruits and vegetables. ? Eat whole grains, such as whole-wheat pasta, brown rice, or whole-grain bread. Fill about one-fourth of your plate with whole grains. ? Eat low-fat dairy products. ? Avoid fatty cuts of meat, processed or cured meats, and poultry with skin. Fill about one-fourth of your plate with lean proteins such as fish, chicken without skin, beans, eggs, and tofu. ? Avoid pre-made and processed foods. These tend to be higher in sodium, added sugar, and fat.  Reduce your daily sodium intake. Most people with hypertension should eat less than 1,500 mg of sodium a day.   Lifestyle  Work with your health care provider to maintain a healthy body weight or to lose weight. Ask what an ideal weight is for you.  Get at least 30 minutes of exercise that causes your heart to beat faster (aerobic exercise) most days of the week. Activities may include walking, swimming, or biking.  Include exercise to strengthen your muscles (resistance exercise), such as weight lifting, as part of your weekly exercise routine. Try to do these types of exercises for 30 minutes at least 3 days a week.  Do not use any products that contain nicotine or tobacco, such as  cigarettes, e-cigarettes, and chewing tobacco. If you need help quitting, ask your health care provider.  Control any long-term (chronic) conditions you have, such as high cholesterol or diabetes.  Identify your sources of stress and find ways to manage stress. This may include meditation, deep breathing, or making time for fun activities.   Alcohol use  Do not drink alcohol if: ? Your health care provider tells you not to drink. ? You are pregnant, may be pregnant, or are planning to become pregnant.  If you drink alcohol: ? Limit how much you use to:  0-1 drink a day for women.  0-2 drinks a day for men. ? Be aware of how much alcohol is in your drink. In the [place] one  drink equals one 12 oz bottle of beer (355 mL), one 5 oz glass of wine (148 mL), or one 1 oz glass of hard liquor (44 mL). Medicines Your health care provider may prescribe medicine if lifestyle changes are not enough to get your blood pressure under control and if:  Your systolic blood pressure is 130 or higher.  Your diastolic blood pressure is 80 or higher. Take medicines only as told by your health care provider. Follow the directions carefully. Blood pressure medicines must be taken as told by your health care provider. The medicine does not work as well when you skip doses. Skipping doses also puts you at risk for problems. Monitoring Before you monitor your blood pressure:  Do not smoke, drink caffeinated beverages, or exercise within 30 minutes before taking a measurement.  Use the bathroom and empty your bladder (urinate).  Sit quietly for at least 5 minutes before taking measurements. Monitor your blood pressure at home as told by your health care provider. To do this:  Sit with your back straight and supported.  Place your feet flat on the floor. Do not cross your legs.  Support your arm on a flat surface, such as a table. Make sure your upper arm is at heart level.  Each time you measure, take  two or three readings one minute apart and record the results. You may also need to have your blood pressure checked regularly by your health care provider.   General information  Talk with your health care provider about your diet, exercise habits, and other lifestyle factors that may be contributing to hypertension.  Review all the medicines you take with your health care provider because there may be side effects or interactions.  Keep all visits as told by your health care provider. Your health care provider can help you create and adjust your plan for managing your high blood pressure. Where to find more information  National Heart, Lung, and Blood Institute: https://wilson-eaton.com/  American Heart Association: www.heart.org Contact a health care provider if:  You think you are having a reaction to medicines you have taken.  You have repeated (recurrent) headaches.  You feel dizzy.  You have swelling in your ankles.  You have trouble with your vision. Get help right away if:  You develop a severe headache or confusion.  You have unusual weakness or numbness, or you feel faint.  You have severe pain in your chest or abdomen.  You vomit repeatedly.  You have trouble breathing. These symptoms may represent a serious problem that is an emergency. Do not wait to see if the symptoms will go away. Get medical help right away. Call your local emergency services (911 in the [place] Do not drive yourself to the hospital. Summary  Hypertension is when the force of blood pumping through your arteries is too strong. If this condition is not controlled, it may put you at risk for serious complications.  Your personal target blood pressure may vary depending on your medical conditions, your age, and other factors. For most people, a normal blood pressure is less than 120/80.  Hypertension is managed by lifestyle changes, medicines, or both.  Lifestyle changes to help manage hypertension  include losing weight, eating a healthy, low-sodium diet, exercising more, stopping smoking, and limiting alcohol. This information is not intended to replace advice given to you by your health care provider. Make sure you discuss any questions you have with your health care provider. Document Revised: 02/04/2019 Document Reviewed:  11/30/2018 Elsevier Patient Education  2021 [place] [facility]'s number is [phone]. Please call with questions or concerns about what we discussed today.

## 2020-05-22 ENCOUNTER — Other Ambulatory Visit: Payer: Self-pay

## 2020-05-22 ENCOUNTER — Ambulatory Visit (INDEPENDENT_AMBULATORY_CARE_PROVIDER_SITE_OTHER): Payer: Medicare Other | Admitting: Family Medicine

## 2020-05-22 ENCOUNTER — Encounter: Payer: Self-pay | Admitting: Family Medicine

## 2020-05-22 VITALS — BP 124/78 | HR 49 | Ht 75.0 in | Wt 154.8 lb

## 2020-05-22 DIAGNOSIS — E782 Mixed hyperlipidemia: Secondary | ICD-10-CM | POA: Diagnosis not present

## 2020-05-22 DIAGNOSIS — H9312 Tinnitus, left ear: Secondary | ICD-10-CM | POA: Insufficient documentation

## 2020-05-22 DIAGNOSIS — F17210 Nicotine dependence, cigarettes, uncomplicated: Secondary | ICD-10-CM

## 2020-05-22 NOTE — Progress Notes (Signed)
    SUBJECTIVE:   CHIEF COMPLAINT / HPI:   Patient states that when he shakes his head he has a buzzing sound in his left ear like something is in it.  This resolves on its own.  This only occurs when he shakes his head.  No hearing loss.  No vertigo.  Smoking: Patient is still trying to quit smoking.  He is a half a pack a day smoker currently.  Does not want to use Chantix for reasons stated previously.  Not currently trying nicotine replacement therapy.  Tried patch in the past, but has not tried lozenge or gum.  Willing to get another low-dose CT scan.  HLD: Patient willing to get lipid screening, but not today as he does not have the time.  He will call in the future to get this done  PERTINENT  PMH / PSH: AAA  OBJECTIVE:   BP 124/78   Pulse (!) 49   Ht 6\' 3"  (1.905 m)   Wt 154 lb 12.8 oz (70.2 kg)   SpO2 98%   BMI 19.35 kg/m   General: Alert and oriented.  No acute distress. HEENT: Normal ear canals.  Normal tympanic membranes bilaterally.  No cerumen appreciated.  ASSESSMENT/PLAN:   Ear noise/buzzing, left Normal ear exam.  Does not sound like classic tinnitus, therefore will hold off on referral to ENT at this time.  Advised patient if it gets worse or he develops hearing loss or dizziness he should let us know and we can refer to ENT.    Smoking greater than 30 pack years Recommended smoking cessation completely.  We will put in order for low-dose lung cancer screening.  Mixed hyperlipidemia Future lipid panel placed.  Based on previous results patient should be on statin due to increased ASCVD risk. The 10-year ASCVD risk score [name] [name]., et al., 2013) is: 27.1%      Benay Pike, MD [facility]

## 2020-05-22 NOTE — Assessment & Plan Note (Signed)
Recommended smoking cessation completely.  We will put in order for low-dose lung cancer screening.

## 2020-05-22 NOTE — Patient Instructions (Signed)
It was nice to see you today,  I do not see nothing unusual in your ears.  Sometimes this is due to something called tinnitus which can occur with age.  If it becomes more bothersome I can refer you to the ear nose and throat doctor.  I will put in a referral for a lung cancer screening.  Someone will call you when that is scheduled.  To call the smoking cessation hotline call 1 800 quit now.  They can possibly give you a month supply of the nicotine gum.  I would encourage you to quit smoking completely.  Have a great day,  Clemetine Marker, MD

## 2020-05-22 NOTE — Assessment & Plan Note (Signed)
Normal ear exam.  Does not sound like classic tinnitus, therefore will hold off on referral to ENT at this time.  Advised patient if it gets worse or he develops hearing loss or dizziness he should let us know and we can refer to ENT.

## 2020-05-22 NOTE — Assessment & Plan Note (Signed)
Future lipid panel placed.  Based on previous results patient should be on statin due to increased ASCVD risk. The 10-year ASCVD risk score [name] [name]., et al., 2013) is: 27.1%

## 2020-06-18 ENCOUNTER — Ambulatory Visit
Admission: RE | Admit: 2020-06-18 | Discharge: 2020-06-18 | Disposition: A | Discharge status: Home / Self Care | Payer: Medicare Other | Source: Ambulatory Visit | Attending: Family Medicine | Admitting: Family Medicine

## 2020-06-18 ENCOUNTER — Other Ambulatory Visit: Payer: Self-pay

## 2020-06-18 DIAGNOSIS — Z87891 Personal history of nicotine dependence: Secondary | ICD-10-CM | POA: Diagnosis not present

## 2020-06-18 DIAGNOSIS — F17210 Nicotine dependence, cigarettes, uncomplicated: Secondary | ICD-10-CM

## 2020-08-09 DIAGNOSIS — H2513 Age-related nuclear cataract, bilateral: Secondary | ICD-10-CM | POA: Diagnosis not present

## 2020-08-09 DIAGNOSIS — H402232 Chronic angle-closure glaucoma, bilateral, moderate stage: Secondary | ICD-10-CM | POA: Diagnosis not present

## 2020-08-09 DIAGNOSIS — H401133 Primary open-angle glaucoma, bilateral, severe stage: Secondary | ICD-10-CM | POA: Diagnosis not present

## 2020-12-17 ENCOUNTER — Other Ambulatory Visit: Payer: Self-pay | Admitting: Family Medicine

## 2020-12-17 DIAGNOSIS — Z0000 Encounter for general adult medical examination without abnormal findings: Secondary | ICD-10-CM

## 2021-01-17 DIAGNOSIS — H2513 Age-related nuclear cataract, bilateral: Secondary | ICD-10-CM | POA: Diagnosis not present

## 2021-01-17 DIAGNOSIS — H401133 Primary open-angle glaucoma, bilateral, severe stage: Secondary | ICD-10-CM | POA: Diagnosis not present

## 2021-01-17 DIAGNOSIS — H402232 Chronic angle-closure glaucoma, bilateral, moderate stage: Secondary | ICD-10-CM | POA: Diagnosis not present

## 2021-02-19 DIAGNOSIS — H401133 Primary open-angle glaucoma, bilateral, severe stage: Secondary | ICD-10-CM | POA: Diagnosis not present

## 2021-02-19 DIAGNOSIS — H402232 Chronic angle-closure glaucoma, bilateral, moderate stage: Secondary | ICD-10-CM | POA: Diagnosis not present

## 2021-02-19 DIAGNOSIS — H2513 Age-related nuclear cataract, bilateral: Secondary | ICD-10-CM | POA: Diagnosis not present

## 2021-03-08 ENCOUNTER — Other Ambulatory Visit: Payer: Self-pay

## 2021-03-08 DIAGNOSIS — I714 Abdominal aortic aneurysm, without rupture, unspecified: Secondary | ICD-10-CM

## 2021-03-11 ENCOUNTER — Ambulatory Visit (HOSPITAL_COMMUNITY)
Admission: RE | Admit: 2021-03-11 | Discharge: 2021-03-11 | Disposition: A | Discharge status: Home / Self Care | Payer: Medicare Other | Source: Ambulatory Visit | Attending: Surgery | Admitting: Surgery

## 2021-03-11 ENCOUNTER — Other Ambulatory Visit: Payer: Self-pay

## 2021-03-11 ENCOUNTER — Ambulatory Visit: Payer: Medicare Other | Admitting: Physician Assistant

## 2021-03-11 VITALS — BP 155/80 | HR 41 | Temp 97.8°F | Resp 20 | Ht 75.0 in | Wt 160.8 lb

## 2021-03-11 DIAGNOSIS — I714 Abdominal aortic aneurysm, without rupture, unspecified: Secondary | ICD-10-CM | POA: Insufficient documentation

## 2021-03-11 DIAGNOSIS — Z95828 Presence of other vascular implants and grafts: Secondary | ICD-10-CM | POA: Diagnosis not present

## 2021-03-11 NOTE — Progress Notes (Signed)
Office Note     CC:  follow up Requesting Provider:  Gifford Shave, MD  HPI: Duane Delgado is a 71 y.o. (Aug 03, 1950) male who presents for surveillance follow up for EVAR. He has remote history of EVAR on 09/01/13 by Dr. Trula Slade. He has been doing well since. His Aorta has been 4.5 cm at maximal diameter with no endoleak on duplex follow up.   He denies any abdominal pain or back pain. He has no lower extremity claudication, rest pain or tissue loss. He does report very intermittent tingling in his feet at rest. Says this occurs every once in a while. No associated pain.  The pt is not on a statin for cholesterol management.  The pt is not on a daily aspirin.   Other AC:  none The pt is on ARB for hypertension.   The pt is not diabetic.   Tobacco hx:  current, 1/2 ppd  Past Medical History:  Diagnosis Date   Glaucoma    Hypertension     Past Surgical History:  Procedure Laterality Date   ABDOMINAL AORTIC ENDOVASCULAR STENT GRAFT N/A 09/01/2013   Procedure: ABDOMINAL AORTIC ENDOVASCULAR STENT GRAFT;  Surgeon: Serafina Mitchell, MD;  Location: [facility] OR;  Service: Vascular;  Laterality: N/A;    Social History   Socioeconomic History   Marital status: Married    Spouse name: Roland Rack   Number of children: 2   Years of education: 12   Highest education level: Not on file  Occupational History   Not on file  Tobacco Use   Smoking status: Every Day    Packs/day: 0.50    Types: Cigarettes    Passive exposure: Never   Smokeless tobacco: Never  Vaping Use   Vaping Use: Never used  Substance and Sexual Activity   Alcohol use: Yes    Alcohol/week: 0.0 standard drinks    Comment: drinks 3-4 drinks of liquor per day   Drug use: No   Sexual activity: Yes  Other Topics Concern   Not on file  Social History Narrative   Patient lives in [place] with his wife.    Patient has 2 sons and 6 grandchildren, they live locally.    Patient is still working for a tree company 4x a  week.   Patients exercise comes outdoor work.   Patient enjoys fishing, playing horseshoes, and seeing family and friends.    Social Determinants of Health   Financial Resource Strain: Low Risk    Difficulty of Paying Living Expenses: Not hard at all  Food Insecurity: No Food Insecurity   Worried About Charity fundraiser in the Last Year: Never true   [employer] in the Last Year: Never true  Transportation Needs: No Transportation Needs   Lack of Transportation (Medical): No   Lack of Transportation (Non-Medical): No  Physical Activity: Sufficiently Active   Days of Exercise per Week: 4 days   Minutes of Exercise per Session: 60 min  Stress: No Stress Concern Present   Feeling of Stress : Not at all  Social Connections: Moderately Integrated   Frequency of Communication with Friends and Family: More than three times a week   Frequency of Social Gatherings with Friends and Family: More than three times a week   Attends Religious Services: More than 4 times per year   Active Member of [employer] or Organizations: No   Attends Archivist Meetings: Never   Marital Status: Married  Human resources officer Violence:  Not At Risk   Fear of Current or Ex-Partner: No   Emotionally Abused: No   Physically Abused: No   Sexually Abused: No    Family History  Problem Relation Age of Onset   Cancer Sister     Current Outpatient Medications  Medication Sig Dispense Refill   COSOPT 22.3-6.8 MG/ML ophthalmic solution 1 drop 2 (two) times daily.     ketorolac (ACULAR) 0.5 % ophthalmic solution SMARTSIG:In Eye(s)     latanoprost (XALATAN) 0.005 % ophthalmic solution Place 1 drop into both eyes at bedtime.      losartan (COZAAR) 50 MG tablet TAKE 1 TABLET(50 MG) BY MOUTH DAILY 90 tablet 0   LUMIGAN 0.01 % SOLN SMARTSIG:In Eye(s)     No current facility-administered medications for this visit.    No Known Allergies   REVIEW OF SYSTEMS:  [X]  denotes positive finding, [ ]  denotes  negative finding Cardiac  Comments:  Chest pain or chest pressure:    Shortness of breath upon exertion:    Short of breath when lying flat:    Irregular heart rhythm:        Vascular    Pain in calf, thigh, or hip brought on by ambulation:    Pain in feet at night that wakes you up from your sleep:     Blood clot in your veins:    Leg swelling:         Pulmonary    Oxygen at home:    Productive cough:     Wheezing:         Neurologic    Sudden weakness in arms or legs:     Sudden numbness in arms or legs:     Sudden onset of difficulty speaking or slurred speech:    Temporary loss of vision in one eye:     Problems with dizziness:         Gastrointestinal    Blood in stool:     Vomited blood:         Genitourinary    Burning when urinating:     Blood in urine:        Psychiatric    Major depression:         Hematologic    Bleeding problems:    Problems with blood clotting too easily:        Skin    Rashes or ulcers:        Constitutional    Fever or chills:      PHYSICAL EXAMINATION:  Vitals:   03/11/21 0943  BP: (!) 155/80  Pulse: (!) 41  Resp: 20  Temp: 97.8 F (36.6 C)  TempSrc: Temporal  SpO2: 99%  Weight: 160 lb 12.8 oz (72.9 kg)  Height: 6\' 3"  (1.905 m)    General:  thin in NAD; vital signs documented above Gait: Normal HENT: WNL, normocephalic Pulmonary: normal non-labored breathing , without wheezing Cardiac: regular HR, without  Murmurs without carotid bruit Abdomen: soft, NT, no masses. Palpable Abdominal aortic pulse Vascular Exam/Pulses:  Right Left  Radial 2+ (normal) 2+ (normal)  Femoral 2+ (normal) 2+ (normal)  Popliteal Not palpable Not palpable  DP 2+ (normal) 2+ (normal)  PT Not palpable Not palpable   Extremities: without ischemic changes, without Gangrene , without cellulitis; without open wounds;  Musculoskeletal: no muscle wasting or atrophy  Neurologic: A&O X 3;  No focal weakness or paresthesias are  detected Psychiatric:  The pt has Normal affect.   Non-Invasive  Vascular Imaging:   VAS [place] EVAR Duplex: 03/11/21 Endovascular Aortic Repair (EVAR):  +----------+----------------+-------------------+-------------------+              Diameter AP (cm) Diameter Trans (cm) Velocities (cm/sec)   +----------+----------------+-------------------+-------------------+   Aorta      4.72             4.99                69                    +----------+----------------+-------------------+-------------------+   Right Limb 2.17             2.36                47                    +----------+----------------+-------------------+-------------------+   Left Limb  1.85             2.03                44                    +----------+----------------+-------------------+-------------------+   Summary:  Abdominal Aorta: Patent endovascular aneurysm repair with no evidence of endoleak. Previous diameter measurement was 4.5 x 4.32 cm obtained on 03/11/19.     ASSESSMENT/PLAN:: 71 y.o. male here for follow up for surveillance follow up for EVAR. He has remote history of EVAR on 09/01/13 by Dr. Trula Slade. Duplex today shows slight increase in maximum diameter of Aorta from 4.5 to 4.9. No evidence of endoleak. He has no associated symptoms.  -Encourage smoking cessation - he knows to follow up earlier if he has any new or concerning symptoms - He will follow up in 1 year with repeat EVAR Duplex   Karoline Caldwell, PA-C [facility] [phone]  Clinic MD:   Trula Slade

## 2021-03-18 ENCOUNTER — Other Ambulatory Visit: Payer: Self-pay | Admitting: Family Medicine

## 2021-03-18 DIAGNOSIS — Z0000 Encounter for general adult medical examination without abnormal findings: Secondary | ICD-10-CM

## 2021-03-19 DIAGNOSIS — H401133 Primary open-angle glaucoma, bilateral, severe stage: Secondary | ICD-10-CM | POA: Diagnosis not present

## 2021-03-19 DIAGNOSIS — H2513 Age-related nuclear cataract, bilateral: Secondary | ICD-10-CM | POA: Diagnosis not present

## 2021-03-19 DIAGNOSIS — H402232 Chronic angle-closure glaucoma, bilateral, moderate stage: Secondary | ICD-10-CM | POA: Diagnosis not present

## 2021-06-18 DIAGNOSIS — H402232 Chronic angle-closure glaucoma, bilateral, moderate stage: Secondary | ICD-10-CM | POA: Diagnosis not present

## 2021-06-18 DIAGNOSIS — H47233 Glaucomatous optic atrophy, bilateral: Secondary | ICD-10-CM | POA: Diagnosis not present

## 2021-06-18 DIAGNOSIS — H401133 Primary open-angle glaucoma, bilateral, severe stage: Secondary | ICD-10-CM | POA: Diagnosis not present

## 2021-06-18 DIAGNOSIS — H2513 Age-related nuclear cataract, bilateral: Secondary | ICD-10-CM | POA: Diagnosis not present

## 2022-02-25 DIAGNOSIS — H401133 Primary open-angle glaucoma, bilateral, severe stage: Secondary | ICD-10-CM | POA: Diagnosis not present

## 2022-02-25 DIAGNOSIS — H402232 Chronic angle-closure glaucoma, bilateral, moderate stage: Secondary | ICD-10-CM | POA: Diagnosis not present

## 2022-02-25 DIAGNOSIS — H2513 Age-related nuclear cataract, bilateral: Secondary | ICD-10-CM | POA: Diagnosis not present

## 2022-02-25 DIAGNOSIS — H47233 Glaucomatous optic atrophy, bilateral: Secondary | ICD-10-CM | POA: Diagnosis not present

## 2022-05-21 ENCOUNTER — Ambulatory Visit (INDEPENDENT_AMBULATORY_CARE_PROVIDER_SITE_OTHER): Payer: Medicare Other | Admitting: Student

## 2022-05-21 ENCOUNTER — Encounter: Payer: Self-pay | Admitting: Student

## 2022-05-21 ENCOUNTER — Ambulatory Visit (HOSPITAL_COMMUNITY)
Admission: RE | Admit: 2022-05-21 | Discharge: 2022-05-21 | Disposition: A | Discharge status: Home / Self Care | Payer: Medicare Other | Source: Ambulatory Visit | Attending: Family Medicine | Admitting: Family Medicine

## 2022-05-21 VITALS — BP 130/84 | HR 45 | Ht 75.0 in | Wt 161.0 lb

## 2022-05-21 DIAGNOSIS — R001 Bradycardia, unspecified: Secondary | ICD-10-CM | POA: Diagnosis not present

## 2022-05-21 DIAGNOSIS — C189 Malignant neoplasm of colon, unspecified: Secondary | ICD-10-CM

## 2022-05-21 DIAGNOSIS — Z0000 Encounter for general adult medical examination without abnormal findings: Secondary | ICD-10-CM

## 2022-05-21 DIAGNOSIS — Z122 Encounter for screening for malignant neoplasm of respiratory organs: Secondary | ICD-10-CM | POA: Diagnosis not present

## 2022-05-21 NOTE — Patient Instructions (Addendum)
It was wonderful to meet you today. Thank you for allowing me to be a part of your care. Below is a short summary of what we discussed at your visit today:  All you are doing well annual exam today was normal.  Your EKG was normal other than your slow heart rate.  We ordered some labs today to check your blood count, thyroid, kidney and electrolytes.  Also ordered labs such as folate, thiamine and B12 due to your concerns with balance.  I also suspect this could be attributed to your alcohol use and recommend cutting down significantly on alcohol use.  I have placed the gastro referral for your colonoscopy to screen for colon cancer.  You should receive a call from them to schedule an appointment.  I have also placed an order for chest imaging to screen for lung cancer given that you have been a chronic tobacco user.  Follow-up with me in 2 months to reassess your with balance.  Please bring all of your medications to every appointment!  If you have any questions or concerns, please do not hesitate to contact us via phone or MyChart message.   Jerre Simon, MD [facility]      Health Maintenance After Age 7 After age 38, you are at a higher risk for certain long-term diseases and infections as well as injuries from falls. Falls are a major cause of broken bones and head injuries in people who are older than age 58. Getting regular preventive care can help to keep you healthy and well. Preventive care includes getting regular testing and making lifestyle changes as recommended by your health care provider. Talk with your health care provider about: Which screenings and tests you should have. A screening is a test that checks for a disease when you have no symptoms. A diet and exercise plan that is right for you. What should I know about screenings and tests to prevent falls? Screening and testing are the best ways to find a health problem early. Early diagnosis  and treatment give you the best chance of managing medical conditions that are common after age 65. Certain conditions and lifestyle choices may make you more likely to have a fall. Your health care provider may recommend: Regular vision checks. Poor vision and conditions such as cataracts can make you more likely to have a fall. If you wear glasses, make sure to get your prescription updated if your vision changes. Medicine review. Work with your health care provider to regularly review all of the medicines you are taking, including over-the-counter medicines. Ask your health care provider about any side effects that may make you more likely to have a fall. Tell your health care provider if any medicines that you take make you feel dizzy or sleepy. Strength and balance checks. Your health care provider may recommend certain tests to check your strength and balance while standing, walking, or changing positions. Foot health exam. Foot pain and numbness, as well as not wearing proper footwear, can make you more likely to have a fall. Screenings, including: Osteoporosis screening. Osteoporosis is a condition that causes the bones to get weaker and break more easily. Blood pressure screening. Blood pressure changes and medicines to control blood pressure can make you feel dizzy. Depression screening. You may be more likely to have a fall if you have a fear of falling, feel depressed, or feel unable to do activities that you used to do. Alcohol use screening. Using too  much alcohol can affect your balance and may make you more likely to have a fall. Follow these instructions at home: Lifestyle Do not drink alcohol if: Your health care provider tells you not to drink. If you drink alcohol: Limit how much you have to: 0-1 drink a day for women. 0-2 drinks a day for men. Know how much alcohol is in your drink. In the [place] one drink equals one 12 oz bottle of beer (355 mL), one 5 oz glass of wine (148  mL), or one 1 oz glass of hard liquor (44 mL). Do not use any products that contain nicotine or tobacco. These products include cigarettes, chewing tobacco, and vaping devices, such as e-cigarettes. If you need help quitting, ask your health care provider. Activity  Follow a regular exercise program to stay fit. This will help you maintain your balance. Ask your health care provider what types of exercise are appropriate for you. If you need a cane or walker, use it as recommended by your health care provider. Wear supportive shoes that have nonskid soles. Safety  Remove any tripping hazards, such as rugs, cords, and clutter. Install safety equipment such as grab bars in bathrooms and safety rails on stairs. Keep rooms and walkways well-lit. General instructions Talk with your health care provider about your risks for falling. Tell your health care provider if: You fall. Be sure to tell your health care provider about all falls, even ones that seem minor. You feel dizzy, tiredness (fatigue), or off-balance. Take over-the-counter and prescription medicines only as told by your health care provider. These include supplements. Eat a healthy diet and maintain a healthy weight. A healthy diet includes low-fat dairy products, low-fat (lean) meats, and fiber from whole grains, beans, and lots of fruits and vegetables. Stay current with your vaccines. Schedule regular health, dental, and eye exams. Summary Having a healthy lifestyle and getting preventive care can help to protect your health and wellness after age 72. Screening and testing are the best way to find a health problem early and help you avoid having a fall. Early diagnosis and treatment give you the best chance for managing medical conditions that are more common for people who are older than age 37. Falls are a major cause of broken bones and head injuries in people who are older than age 42. Take precautions to prevent a fall at  home. Work with your health care provider to learn what changes you can make to improve your health and wellness and to prevent falls. This information is not intended to replace advice given to you by your health care provider. Make sure you discuss any questions you have with your health care provider. Document Revised: 05/21/2020 Document Reviewed: 05/21/2020 Elsevier Patient Education  2023 [employer].

## 2022-05-21 NOTE — Progress Notes (Signed)
Annual Wellness Visit     Patient: ERYN STALLINS, Male    DOB: [birth year], 72 y.o.   MRN: [id number]  Subjective  Chief Complaint  Patient presents with   Annual Exam   balance concerns    ATTIKUS ROSENDALE is a 73 y.o. male who presents today for his Annual Wellness Visit.  Diet: Good appetite and regular diet. Usually 2 meals aday Sleep:Good, sleep through the night about 6-7hrs  Exercise:No exercise but stay active Alcohol use: 3-4 days a week about 2-3 shots  Tobacco use: 1 pack a day for over 30years Illicit drug use: occasional Marihuana  Sexually active: Yes to 1 partner (wife) Works as: Retired as a Scientist, physiological  Lives with: in a house wife  Medical concerns: Balance issue, sometimes feel like he can't control or stop his movement.  Denies any falls, lightheadedness, dizziness or lower extremity weakness.  Abdominal aortic aneurysm Follow VVS and last [place] in 2023 show AAA progressed from 4.5 to 4.9. Plan for surveillance with recheck planned for this year.  He has an upcoming appointment with to continue close surveillance.   Objective  BP 130/84   Pulse (!) 45   Ht 6\' 3"  (1.905 m)   Wt 161 lb (73 kg)   SpO2 100%   BMI 20.12 kg/m   Physical Exam Constitutional:      Appearance: Normal appearance. He is normal weight.  HENT:     Head: Normocephalic and atraumatic.     Right Ear: Tympanic membrane, ear canal and external ear normal.     Left Ear: Tympanic membrane, ear canal and external ear normal.     Mouth/Throat:     Mouth: Mucous membranes are moist.     Pharynx: Oropharynx is clear.  Eyes:     Extraocular Movements: Extraocular movements intact.     Conjunctiva/sclera: Conjunctivae normal.     Pupils: Pupils are equal, round, and reactive to light.  Cardiovascular:     Rate and Rhythm: Regular rhythm. Bradycardia present.     Heart sounds: No murmur heard. Pulmonary:     Effort: Pulmonary effort is normal.     Breath  sounds: Normal breath sounds.  Abdominal:     General: Abdomen is flat. Bowel sounds are normal.     Palpations: Abdomen is soft.  Musculoskeletal:        General: Normal range of motion.     Cervical back: Normal range of motion.  Skin:    General: Skin is warm and dry.     Capillary Refill: Capillary refill takes less than 2 seconds.  Neurological:     General: No focal deficit present.     Mental Status: He is alert and oriented to person, place, and time. Mental status is at baseline.     Comments: Normal gait but wobbly with straight line walk.  Psychiatric:        Mood and Affect: Mood normal.        Behavior: Behavior normal.     Most recent fall risk assessment:    05/21/2022    9:44 AM  Fall Risk   Falls in the past year? 0    Most recent depression screenings:    05/21/2022    9:46 AM 05/22/2020   11:27 AM  PHQ 2/9 Scores  PHQ - 2 Score 3 0  PHQ- 9 Score 5 0   Most recent cognitive screening:    05/16/2020   10:20 AM  6CIT Screen  What Year? 0 points  What month? 0 points  What time? 0 points  Count back from 20 0 points  Months in reverse 0 points  Repeat phrase 0 points  Total Score 0 points     Assessment & Plan   72 year old male presenting for annual wellness visit.  Overall doing well with good health maintenance.    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  This seems to be chronic finding for patient.  EKG was obtained which shows sinus bradycardia with no ST changes and unchanged from few years ago.  Generally asymptomatic -EKG obtained -Discussed scope for if symptomatic -Reviewed return precautions with patient which he verbalized understanding.  Balance concerns Patient reports any issues with balance but denies any dizziness, lightheadedness, lower extremity swelling or extremity weakness.  Exam is generally wall ordered and wobbly gait with straight line walk.  Does have history of drinking out 3-4 shots  of liquor about 4 days a week.  Unclear cause of his issues with balance but given his drinking history cannot rule out Warnicke encephalopathy. -Obtain labs for folate, thiamine, B12, TSH -Encouraged avoiding/quitting alcohol consumption -Provided patient with information on fall risk -Follow-up in 2 months to reevaluate balance concerns  Abdominal aortic aneurysm Patient is due for follow-up with vascular surgery. -Encourage patient to follow-up with vascular surgery for annual surveillance -Advised patient on smoking cessation  Note maintenance -Obtained low-dose chest CT for smoker greater than 30 years -Ordered GI referral for age-appropriate colonoscopy colon cancer screening   Annual wellness visit done today including the all of the following: Reviewed patient's Family Medical History Reviewed and updated list of patient's medical providers Assessment of cognitive impairment was done Assessed patient's functional ability Established a written schedule for health screening services Health Risk Assessent Completed and Reviewed   Health Maintenance  Topic Date Due   COVID-19 Vaccine (1) Never done   Pneumonia Vaccine 59+ Years old (1 of 2 - PCV) Never done   DTaP/Tdap/Td (1 - Tdap) Never done   Zoster Vaccines- Shingrix (1 of 2) Never done   Medicare Annual Wellness (AWV)  05/16/2021   Lung Cancer Screening  06/18/2021   COLONOSCOPY (Pts 45-12yrs Insurance coverage will need to be confirmed)  06/27/2021   INFLUENZA VACCINE  08/14/2022   Hepatitis C Screening  Completed   HPV VACCINES  Aged Out     Discussed health benefits of physical activity, and encouraged him to engage in regular exercise appropriate for his age and condition.    Problem List Items Addressed This Visit       Other   Encounter for screening for lung cancer   Healthcare maintenance - Primary   Relevant Orders   CT CHEST LUNG CA SCREEN LOW DOSE W/O CM   CBC   Basic Metabolic Panel    Microalbumin/Creatinine Ratio, Urine   TSH   Vitamin B12   Vitamin B1   Folate   Other Visit Diagnoses     Malignant neoplasm of colon, unspecified part of colon [facility])       Relevant Orders   Ambulatory referral to Gastroenterology       Follow-up in 2 months to reevaluate balance issues.  Could benefit from geriatric referral with Dr. Perley Jain  Jerre Simon, MD

## 2022-05-27 DIAGNOSIS — H402232 Chronic angle-closure glaucoma, bilateral, moderate stage: Secondary | ICD-10-CM | POA: Diagnosis not present

## 2022-05-27 DIAGNOSIS — H47233 Glaucomatous optic atrophy, bilateral: Secondary | ICD-10-CM | POA: Diagnosis not present

## 2022-05-27 DIAGNOSIS — H2513 Age-related nuclear cataract, bilateral: Secondary | ICD-10-CM | POA: Diagnosis not present

## 2022-05-27 DIAGNOSIS — H401133 Primary open-angle glaucoma, bilateral, severe stage: Secondary | ICD-10-CM | POA: Diagnosis not present

## 2022-05-28 LAB — BASIC METABOLIC PANEL
BUN/Creatinine Ratio: 16 (ref 10–24)
BUN: 14 mg/dL (ref 8–27)
CO2: 21 mmol/L (ref 20–29)
Calcium: 9.9 mg/dL (ref 8.6–10.2)
Chloride: 103 mmol/L (ref 96–106)
Creatinine, Ser: 0.85 mg/dL (ref 0.76–1.27)
Glucose: 95 mg/dL (ref 70–99)
Potassium: 4.1 mmol/L (ref 3.5–5.2)
Sodium: 139 mmol/L (ref 134–144)
eGFR: 93 mL/min/{1.73_m2} (ref >59)

## 2022-05-28 LAB — FOLATE: Folate: 10.1 ng/mL (ref >3.0)

## 2022-05-28 LAB — CBC
Hematocrit: 42 % (ref 37.5–51.0)
Hemoglobin: 13.9 g/dL (ref 13.0–17.7)
MCH: 29.7 pg (ref 26.6–33.0)
MCHC: 33.1 g/dL (ref 31.5–35.7)
MCV: 90 fL (ref 79–97)
Platelets: 174 10*3/uL (ref 150–450)
RBC: 4.68 x10E6/uL (ref 4.14–5.80)
RDW: 13.3 % (ref 11.6–15.4)
WBC: 5.9 10*3/uL (ref 3.4–10.8)

## 2022-05-28 LAB — MICROALBUMIN / CREATININE URINE RATIO
Creatinine, Urine: 113.8 mg/dL
Microalb/Creat Ratio: 5 mg/g creat (ref 0–29)
Microalbumin, Urine: 5.8 ug/mL

## 2022-05-28 LAB — VITAMIN B1: Thiamine: 69.1 nmol/L (ref 66.5–200.0)

## 2022-05-28 LAB — TSH: TSH: 1.21 u[IU]/mL (ref 0.450–4.500)

## 2022-05-28 LAB — VITAMIN B12: Vitamin B-12: 655 pg/mL (ref 232–1245)

## 2022-05-29 NOTE — Addendum Note (Signed)
Addended by: Darnelle Maffucci on: 05/29/2022 02:44 PM   Modules accepted: Orders

## 2022-06-24 DIAGNOSIS — H2513 Age-related nuclear cataract, bilateral: Secondary | ICD-10-CM | POA: Diagnosis not present

## 2022-06-24 DIAGNOSIS — H401133 Primary open-angle glaucoma, bilateral, severe stage: Secondary | ICD-10-CM | POA: Diagnosis not present

## 2022-06-24 DIAGNOSIS — H47233 Glaucomatous optic atrophy, bilateral: Secondary | ICD-10-CM | POA: Diagnosis not present

## 2022-06-24 DIAGNOSIS — H402232 Chronic angle-closure glaucoma, bilateral, moderate stage: Secondary | ICD-10-CM | POA: Diagnosis not present

## 2022-06-25 ENCOUNTER — Ambulatory Visit
Admission: RE | Admit: 2022-06-25 | Discharge: 2022-06-25 | Disposition: A | Discharge status: Home / Self Care | Payer: Medicare Other | Source: Ambulatory Visit | Attending: Family Medicine | Admitting: Family Medicine

## 2022-06-25 DIAGNOSIS — F17210 Nicotine dependence, cigarettes, uncomplicated: Secondary | ICD-10-CM | POA: Diagnosis not present

## 2022-06-25 DIAGNOSIS — Z0000 Encounter for general adult medical examination without abnormal findings: Secondary | ICD-10-CM

## 2022-07-11 ENCOUNTER — Encounter: Payer: Self-pay | Admitting: Student

## 2022-07-11 ENCOUNTER — Ambulatory Visit (INDEPENDENT_AMBULATORY_CARE_PROVIDER_SITE_OTHER): Payer: Medicare Other | Admitting: Student

## 2022-07-11 DIAGNOSIS — R2689 Other abnormalities of gait and mobility: Secondary | ICD-10-CM

## 2022-07-11 NOTE — Assessment & Plan Note (Addendum)
Clear cause of patient's intermittent imbalance.  Considering possibly from chronic alcohol use, neurogenic etiology or possible orthostatic although orthostatic vitals were unremarkable at this time.  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  No reports of decreased frequency in episodes and patient currently working on cutting down on drinking, will continue to monitor closely and follow-up in 2-3 months or earlier as needed.  Low threshold for head CT but will hold off for now given reassuring neurological exam. -Discussed  fall precautions -Encourage patient to continue cutting on alcohol use -Follow-up in 3 months.

## 2022-07-11 NOTE — Patient Instructions (Addendum)
It was wonderful to see you today. Thank you for allowing me to be a part of your care. Below is a short summary of what we discussed at your visit today:  I suspect your patient all loss of balance is probably attributed to your chronic alcohol use.   I am glad to hear that you are currently working on cutting down on your alcohol.  The labs that we did including the thiamine, folate and B12 levels were all normal.  Chest CT that was obtained last time was negative for any cancer/malignancy.  Follow-up in 3 months.  Please bring all of your medications to every appointment!  If you have any questions or concerns, please do not hesitate to contact us via phone or MyChart message.   Jerre Simon, MD [facility]

## 2022-07-11 NOTE — Progress Notes (Signed)
    SUBJECTIVE:   CHIEF COMPLAINT / HPI:   72 year old male presenting today for follow-up for issues with balance.  He was seen about a month ago and thiamine, folate and B12 levels were obtained which came back normal.  Patient report he has been cutting down on his drinking and does still have intermittent issues with balance, he reports his imbalance has since improved.  He has not had any falls or trauma to the head.  No loss of consciousness and denies having any dizziness.  PERTINENT  PMH / PSH: Reviewed  OBJECTIVE:   BP 118/60.  O2 stats 98%  Physical Exam General: Alert, well appearing, NAD Cardiovascular: RRR, No Murmurs, Normal S2/S2 Respiratory: CTAB, No wheezing or Rales Abdomen: No distension or tenderness Extremities: No edema on extremities   Neuro: Cranial nerves II to XII intact, no focal neurologic deficits, Normal gate  ASSESSMENT/PLAN:   Imbalance Clear cause of patient's intermittent imbalance.  Considering possibly from chronic alcohol use, neurogenic etiology or possible orthostatic although orthostatic vitals were unremarkable at this time.  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  No reports of decreased frequency in episodes and patient currently working on cutting down on drinking, will continue to monitor closely and follow-up in 2-3 months or earlier as needed.  Low threshold for head CT but will hold off for now given reassuring neurological exam. -Discussed  fall precautions -Encourage patient to continue cutting on alcohol use -Follow-up in 3 months.   Jerre Simon, MD [facility]

## 2022-09-19 ENCOUNTER — Other Ambulatory Visit: Payer: Self-pay | Admitting: *Deleted

## 2022-09-19 DIAGNOSIS — Z95828 Presence of other vascular implants and grafts: Secondary | ICD-10-CM

## 2022-09-23 DIAGNOSIS — H47233 Glaucomatous optic atrophy, bilateral: Secondary | ICD-10-CM | POA: Diagnosis not present

## 2022-09-23 DIAGNOSIS — H401133 Primary open-angle glaucoma, bilateral, severe stage: Secondary | ICD-10-CM | POA: Diagnosis not present

## 2022-09-23 DIAGNOSIS — H2513 Age-related nuclear cataract, bilateral: Secondary | ICD-10-CM | POA: Diagnosis not present

## 2022-09-23 DIAGNOSIS — H402232 Chronic angle-closure glaucoma, bilateral, moderate stage: Secondary | ICD-10-CM | POA: Diagnosis not present

## 2022-09-26 ENCOUNTER — Ambulatory Visit (INDEPENDENT_AMBULATORY_CARE_PROVIDER_SITE_OTHER): Payer: Medicare Other

## 2022-09-26 VITALS — Ht 75.0 in | Wt 161.0 lb

## 2022-09-26 DIAGNOSIS — Z0000 Encounter for general adult medical examination without abnormal findings: Secondary | ICD-10-CM | POA: Diagnosis not present

## 2022-09-26 NOTE — Progress Notes (Signed)
Subjective:   Duane Delgado is a 72 y.o. male who presents for Medicare Annual/Subsequent preventive examination.  Visit Complete: Virtual  I connected with  Glori Luis on 09/26/22 by a audio enabled telemedicine application and verified that I am speaking with the correct person using two identifiers.  Patient Location: Home  Provider Location: Home Office  I discussed the limitations of evaluation and management by telemedicine. The patient expressed understanding and agreed to proceed.  Vital Signs: Because this visit was a virtual/telehealth visit, some criteria may be missing or patient reported. Any vitals not documented were not able to be obtained and vitals that have been documented are patient reported.   Cardiac Risk Factors include: advanced age (>55men, >81 women);male gender;smoking/ tobacco exposure;hypertension     Objective:    Today's Vitals   09/26/22 1117  Weight: 161 lb (73 kg)  Height: 6\' 3"  (1.905 m)   Body mass index is 20.12 kg/m.     09/26/2022   11:23 AM 07/11/2022    9:17 AM 05/21/2022    9:44 AM 05/16/2020   10:16 AM 03/23/2018    8:38 AM 03/10/2018    9:18 AM 07/28/2016    9:18 AM  Advanced Directives  Does Patient Have a Medical Advance Directive? No No No No No No No  Would patient like information on creating a medical advance directive? Yes (MAU/Ambulatory/Procedural Areas - Information given)  No - Patient declined Yes (MAU/Ambulatory/Procedural Areas - Information given) No - Patient declined No - Patient declined     Current Medications (verified) Outpatient Encounter Medications as of 09/26/2022  Medication Sig   COSOPT 22.3-6.8 MG/ML ophthalmic solution 1 drop 2 (two) times daily.   ketorolac (ACULAR) 0.5 % ophthalmic solution SMARTSIG:In Eye(s)   latanoprost (XALATAN) 0.005 % ophthalmic solution Place 1 drop into both eyes at bedtime.    losartan (COZAAR) 50 MG tablet TAKE 1 TABLET(50 MG) BY MOUTH DAILY   LUMIGAN 0.01 % SOLN  SMARTSIG:In Eye(s)   No facility-administered encounter medications on file as of 09/26/2022.    Allergies (verified) Patient has no known allergies.   History: Past Medical History:  Diagnosis Date   Glaucoma    Hypertension    Past Surgical History:  Procedure Laterality Date   ABDOMINAL AORTIC ENDOVASCULAR STENT GRAFT N/A 09/01/2013   Procedure: ABDOMINAL AORTIC ENDOVASCULAR STENT GRAFT;  Surgeon: Nada Libman, MD;  Location: [facility] OR;  Service: Vascular;  Laterality: N/A;   Family History  Problem Relation Age of Onset   Cancer Sister    Social History   Socioeconomic History   Marital status: Married    Spouse name: Ladean Raya   Number of children: 2   Years of education: 12   Highest education level: Not on file  Occupational History   Not on file  Tobacco Use   Smoking status: Every Day    Current packs/day: 0.50    Types: Cigarettes    Passive exposure: Never   Smokeless tobacco: Never  Vaping Use   Vaping status: Never Used  Substance and Sexual Activity   Alcohol use: Yes    Alcohol/week: 0.0 standard drinks of alcohol    Comment: drinks 3-4 drinks of liquor per day   Drug use: No   Sexual activity: Yes  Other Topics Concern   Not on file  Social History Narrative   Patient lives in [place] with his wife.    Patient has 2 sons and 6 grandchildren, they live locally.  Patient is still working for a tree company 4x a week.   Patients exercise comes outdoor work.   Patient enjoys fishing, playing horseshoes, and seeing family and friends.    Social Determinants of Health   Financial Resource Strain: Low Risk  (09/26/2022)   Overall Financial Resource Strain (CARDIA)    Difficulty of Paying Living Expenses: Not hard at all  Food Insecurity: No Food Insecurity (09/26/2022)   Hunger Vital Sign    Worried About Running Out of Food in the Last Year: Never true    Ran Out of Food in the Last Year: Never true  Transportation Needs: No Transportation  Needs (09/26/2022)   PRAPARE - Administrator, Civil Service (Medical): No    Lack of Transportation (Non-Medical): No  Physical Activity: Sufficiently Active (09/26/2022)   Exercise Vital Sign    Days of Exercise per Week: 5 days    Minutes of Exercise per Session: 60 min  Stress: No Stress Concern Present (09/26/2022)   [employer] of Occupational Health - Occupational Stress Questionnaire    Feeling of Stress : Not at all  Social Connections: Moderately Integrated (09/26/2022)   Social Connection and Isolation Panel [NHANES]    Frequency of Communication with Friends and Family: More than three times a week    Frequency of Social Gatherings with Friends and Family: Three times a week    Attends Religious Services: More than 4 times per year    Active Member of Clubs or Organizations: No    Attends Banker Meetings: Never    Marital Status: Married    Tobacco Counseling Ready to quit: Not Answered Counseling given: Not Answered   Clinical Intake:  Pre-visit preparation completed: Yes  Pain : No/denies pain     Diabetes: No  How often do you need to have someone help you when you read instructions, pamphlets, or other written materials from your doctor or pharmacy?: 1 - Never  Interpreter Needed?: No  Information entered by :: Kandis Fantasia LPN   Activities of Daily Living    09/26/2022   11:18 AM  In your present state of health, do you have any difficulty performing the following activities:  Hearing? 0  Vision? 0  Difficulty concentrating or making decisions? 0  Walking or climbing stairs? 0  Dressing or bathing? 0  Doing errands, shopping? 0  Preparing Food and eating ? N  Using the Toilet? N  In the past six months, have you accidently leaked urine? N  Do you have problems with loss of bowel control? N  Managing your Medications? N  Managing your Finances? N  Housekeeping or managing your Housekeeping? N    Patient Care  Team: Jerre Simon, MD as PCP - General (Family Medicine) Chalmers Guest, MD as Consulting Physician (Ophthalmology) Nada Libman, MD as Consulting Physician (Vascular Surgery)  Indicate any recent Medical Services you may have received from other than [facility] providers in the past year (date may be approximate).     Assessment:   This is a routine wellness examination for Dmani.  Hearing/Vision screen Hearing Screening - Comments:: Denies hearing difficulties   Vision Screening - Comments::  up to date with routine eye exams with Dr. Harlon Flor     Goals Addressed             This Visit's Progress    Remain active and independent        Depression Screen    09/26/2022  11:22 AM 07/11/2022    9:17 AM 05/21/2022    9:46 AM 05/22/2020   11:27 AM 05/16/2020   10:20 AM 05/16/2020   10:17 AM 01/27/2020    2:37 PM  PHQ 2/9 Scores  PHQ - 2 Score 0 0 3 0 0 0 0  PHQ- 9 Score  1 5 0   0    Fall Risk    09/26/2022   11:24 AM 07/11/2022    9:17 AM 05/21/2022    9:44 AM 05/16/2020   10:20 AM 12/21/2018   10:32 AM  Fall Risk   Falls in the past year? 0 0 0 0 0  Number falls in past yr: 0      Injury with Fall? 0 0     Risk for fall due to : No Fall Risks      Follow up Falls prevention discussed;Education provided;Falls evaluation completed   Falls prevention discussed     MEDICARE RISK AT HOME: Medicare Risk at Home Any stairs in or around the home?: No If so, are there any without handrails?: No Home free of loose throw rugs in walkways, pet beds, electrical cords, etc?: Yes Adequate lighting in your home to reduce risk of falls?: Yes Life alert?: No Use of a cane, walker or w/c?: No Grab bars in the bathroom?: Yes Shower chair or bench in shower?: No Elevated toilet seat or a handicapped toilet?: No  TIMED UP AND GO:  Was the test performed?  No    Cognitive Function:        09/26/2022   11:24 AM 05/16/2020   10:20 AM  6CIT Screen  What Year? 0 points 0 points   What month? 0 points 0 points  What time? 0 points 0 points  Count back from 20 0 points 0 points  Months in reverse 0 points 0 points  Repeat phrase 0 points 0 points  Total Score 0 points 0 points    Immunizations  There is no immunization history on file for this patient.  TDAP status: Due, Education has been provided regarding the importance of this vaccine. Advised may receive this vaccine at local pharmacy or Health Dept. Aware to provide a copy of the vaccination record if obtained from local pharmacy or Health Dept. Verbalized acceptance and understanding.  Flu Vaccine status: Declined, Education has been provided regarding the importance of this vaccine but patient still declined. Advised may receive this vaccine at local pharmacy or Health Dept. Aware to provide a copy of the vaccination record if obtained from local pharmacy or Health Dept. Verbalized acceptance and understanding.  Pneumococcal vaccine status: Declined,  Education has been provided regarding the importance of this vaccine but patient still declined. Advised may receive this vaccine at local pharmacy or Health Dept. Aware to provide a copy of the vaccination record if obtained from local pharmacy or Health Dept. Verbalized acceptance and understanding.   Covid-19 vaccine status: Declined, Education has been provided regarding the importance of this vaccine but patient still declined. Advised may receive this vaccine at local pharmacy or Health Dept.or vaccine clinic. Aware to provide a copy of the vaccination record if obtained from local pharmacy or Health Dept. Verbalized acceptance and understanding.  Qualifies for Shingles Vaccine? Yes   Zostavax completed No   Shingrix Completed?: No.    Education has been provided regarding the importance of this vaccine. Patient has been advised to call insurance company to determine out of pocket expense if they have not  yet received this vaccine. Advised may also receive  vaccine at local pharmacy or Health Dept. Verbalized acceptance and understanding.  Screening Tests Health Maintenance  Topic Date Due   DTaP/Tdap/Td (1 - Tdap) Never done   COVID-19 Vaccine (1) 10/12/2022 (Originally 06/10/1955)   Zoster Vaccines- Shingrix (1 of 2) 12/26/2022 (Originally 06/11/1969)   INFLUENZA VACCINE  04/13/2023 (Originally 08/14/2022)   Pneumonia Vaccine 1+ Years old (1 of 2 - PCV) 09/26/2023 (Originally 06/10/1956)   Colonoscopy  09/26/2023 (Originally 06/27/2021)   Lung Cancer Screening  06/25/2023   Medicare Annual Wellness (AWV)  09/26/2023   Hepatitis C Screening  Completed   HPV VACCINES  Aged Out    Health Maintenance  Health Maintenance Due  Topic Date Due   DTaP/Tdap/Td (1 - Tdap) Never done    Colorectal cancer screening:  Patient would like to postpone at this time   Lung Cancer Screening: (Low Dose CT Chest recommended if Age 16-80 years, 20 pack-year currently smoking OR have quit w/in 15years.) does qualify.   Lung Cancer Screening Referral: last 06/25/22  Additional Screening:  Hepatitis C Screening: does qualify; Completed 03/10/18  Vision Screening: Recommended annual ophthalmology exams for early detection of glaucoma and other disorders of the eye. Is the patient up to date with their annual eye exam?  Yes  Who is the provider or what is the name of the office in which the patient attends annual eye exams? Dr. Harlon Flor If pt is not established with a provider, would they like to be referred to a provider to establish care? No .   Dental Screening: Recommended annual dental exams for proper oral hygien  Community Resource Referral / Chronic Care Management: CRR required this visit?  No   CCM required this visit?  No     Plan:     I have personally reviewed and noted the following in the patient's chart:   Medical and social history Use of alcohol, tobacco or illicit drugs  Current medications and supplements including opioid  prescriptions. Patient is not currently taking opioid prescriptions. Functional ability and status Nutritional status Physical activity Advanced directives List of other physicians Hospitalizations, surgeries, and ER visits in previous 12 months Vitals Screenings to include cognitive, depression, and falls Referrals and appointments  In addition, I have reviewed and discussed with patient certain preventive protocols, quality metrics, and best practice recommendations. A written personalized care plan for preventive services as well as general preventive health recommendations were provided to patient.     Kandis Fantasia [place], [place]   [id number]   After Visit Summary: (MyChart) Due to this being a telephonic visit, the after visit summary with patients personalized plan was offered to patient via MyChart   Nurse Notes: No concerns at this time

## 2022-09-26 NOTE — Patient Instructions (Addendum)
Mr. Care , Thank you for taking time to come for your Medicare Wellness Visit. I appreciate your ongoing commitment to your health goals. Please review the following plan we discussed and let me know if I can assist you in the future.   Referrals/Orders/Follow-Ups/Clinician Recommendations: Aim for 30 minutes of exercise or brisk walking, 6-8 glasses of water, and 5 servings of fruits and vegetables each day.  This is a list of the screening recommended for you and due dates:  Health Maintenance  Topic Date Due   DTaP/Tdap/Td vaccine (1 - Tdap) Never done   COVID-19 Vaccine (1) 10/12/2022*   Zoster (Shingles) Vaccine (1 of 2) 12/26/2022*   Flu Shot  04/13/2023*   Pneumonia Vaccine (1 of 2 - PCV) 09/26/2023*   Colon Cancer Screening  09/26/2023*   Screening for Lung Cancer  06/25/2023   Medicare Annual Wellness Visit  09/26/2023   Hepatitis C Screening  Completed   HPV Vaccine  Aged Out  *Topic was postponed. The date shown is not the original due date.    Advanced directives: (ACP Link)Information on Advanced Care Planning can be found at [facility] Advance Health Care Directives Advance Health Care Directives ([email or web address]   Next Medicare Annual Wellness Visit scheduled for next year: Yes

## 2022-09-29 ENCOUNTER — Ambulatory Visit: Payer: Medicare Other

## 2022-09-29 ENCOUNTER — Ambulatory Visit (HOSPITAL_COMMUNITY): Payer: Medicare Other | Attending: Surgery

## 2022-11-27 DIAGNOSIS — H402232 Chronic angle-closure glaucoma, bilateral, moderate stage: Secondary | ICD-10-CM | POA: Diagnosis not present

## 2022-11-27 DIAGNOSIS — H2513 Age-related nuclear cataract, bilateral: Secondary | ICD-10-CM | POA: Diagnosis not present

## 2022-11-27 DIAGNOSIS — H47233 Glaucomatous optic atrophy, bilateral: Secondary | ICD-10-CM | POA: Diagnosis not present

## 2022-11-27 DIAGNOSIS — H401133 Primary open-angle glaucoma, bilateral, severe stage: Secondary | ICD-10-CM | POA: Diagnosis not present

## 2023-01-01 DIAGNOSIS — H401133 Primary open-angle glaucoma, bilateral, severe stage: Secondary | ICD-10-CM | POA: Diagnosis not present

## 2023-01-01 DIAGNOSIS — H47233 Glaucomatous optic atrophy, bilateral: Secondary | ICD-10-CM | POA: Diagnosis not present

## 2023-01-01 DIAGNOSIS — H402232 Chronic angle-closure glaucoma, bilateral, moderate stage: Secondary | ICD-10-CM | POA: Diagnosis not present

## 2023-01-01 DIAGNOSIS — H2513 Age-related nuclear cataract, bilateral: Secondary | ICD-10-CM | POA: Diagnosis not present

## 2023-02-19 DIAGNOSIS — H401133 Primary open-angle glaucoma, bilateral, severe stage: Secondary | ICD-10-CM | POA: Diagnosis not present

## 2023-02-19 DIAGNOSIS — H2513 Age-related nuclear cataract, bilateral: Secondary | ICD-10-CM | POA: Diagnosis not present

## 2023-02-19 DIAGNOSIS — H402232 Chronic angle-closure glaucoma, bilateral, moderate stage: Secondary | ICD-10-CM | POA: Diagnosis not present

## 2023-02-19 DIAGNOSIS — H47233 Glaucomatous optic atrophy, bilateral: Secondary | ICD-10-CM | POA: Diagnosis not present

## 2023-03-25 ENCOUNTER — Telehealth: Payer: Self-pay | Admitting: Student

## 2023-03-25 NOTE — Telephone Encounter (Signed)
 Called after receiving an after hour page. Unable to reach patient and Left generic VM

## 2023-06-10 DIAGNOSIS — H47233 Glaucomatous optic atrophy, bilateral: Secondary | ICD-10-CM | POA: Diagnosis not present

## 2023-06-10 DIAGNOSIS — H402232 Chronic angle-closure glaucoma, bilateral, moderate stage: Secondary | ICD-10-CM | POA: Diagnosis not present

## 2023-06-10 DIAGNOSIS — H2513 Age-related nuclear cataract, bilateral: Secondary | ICD-10-CM | POA: Diagnosis not present

## 2023-06-10 DIAGNOSIS — H401133 Primary open-angle glaucoma, bilateral, severe stage: Secondary | ICD-10-CM | POA: Diagnosis not present

## 2023-07-08 DIAGNOSIS — H2513 Age-related nuclear cataract, bilateral: Secondary | ICD-10-CM | POA: Diagnosis not present

## 2023-07-08 DIAGNOSIS — H401133 Primary open-angle glaucoma, bilateral, severe stage: Secondary | ICD-10-CM | POA: Diagnosis not present

## 2023-07-08 DIAGNOSIS — H47233 Glaucomatous optic atrophy, bilateral: Secondary | ICD-10-CM | POA: Diagnosis not present

## 2023-07-08 DIAGNOSIS — H402232 Chronic angle-closure glaucoma, bilateral, moderate stage: Secondary | ICD-10-CM | POA: Diagnosis not present

## 2023-07-22 ENCOUNTER — Ambulatory Visit (INDEPENDENT_AMBULATORY_CARE_PROVIDER_SITE_OTHER)

## 2023-07-22 ENCOUNTER — Ambulatory Visit (HOSPITAL_COMMUNITY)
Admission: RE | Admit: 2023-07-22 | Discharge: 2023-07-22 | Disposition: A | Discharge status: Home / Self Care | Source: Ambulatory Visit | Attending: Family Medicine | Admitting: Family Medicine

## 2023-07-22 VITALS — Ht 75.0 in | Wt 153.6 lb

## 2023-07-22 DIAGNOSIS — I10 Essential (primary) hypertension: Secondary | ICD-10-CM

## 2023-07-22 DIAGNOSIS — R2689 Other abnormalities of gait and mobility: Secondary | ICD-10-CM | POA: Insufficient documentation

## 2023-07-22 DIAGNOSIS — R001 Bradycardia, unspecified: Secondary | ICD-10-CM | POA: Insufficient documentation

## 2023-07-22 DIAGNOSIS — Z122 Encounter for screening for malignant neoplasm of respiratory organs: Secondary | ICD-10-CM | POA: Diagnosis not present

## 2023-07-22 DIAGNOSIS — Z1211 Encounter for screening for malignant neoplasm of colon: Secondary | ICD-10-CM

## 2023-07-22 MED ORDER — LOSARTAN POTASSIUM 50 MG PO TABS: 50.0000 mg | ORAL_TABLET | Freq: Every day | ORAL | 0 refills | Status: DC

## 2023-07-22 NOTE — Assessment & Plan Note (Deleted)
 Orthostatics were negative,

## 2023-07-22 NOTE — Progress Notes (Signed)
    SUBJECTIVE:   CHIEF COMPLAINT / HPI:   Near syncope: When patient stands up or bends over to pick something up sometimes he feels out of it and nearly falls over.  Has had 2 falls in the last month when bending over to pick something up, no head trauma or injuries sustained.  Patient was last seen about a year ago for the same issue, lab works were negative for vitamin deficiencies.  No narrowing vision, no dizziness, no vertigo, nausea, fever, palpitations, paresthesias, or syncope.  He reports his brother also has bradycardia and got a pacemaker put in last month for  Tobacco use: Patient has cut down from 1 pack to half a pack a day.  He has tried quitting in the past going cold [place], with no success.  He has a desire to quit and would like to see Dr. Koval.  He has never had a PFT performed to evaluate for COPD  Shortness of breath: Patient gets winded with exertion, and reports having trouble getting air in after walking for greater than 10 minutes.  Has greater than 50-pack-year smoking history, no formal diagnosis of COPD made and has never used regular inhaler medications.  Health maintenance: He was unaware he was out of date for his colonoscopy and chest CT.  PERTINENT  PMH / PSH: Abdominal aortic aneurysm status post  OBJECTIVE:   Ht 6' 3 (1.905 m)   Wt 153 lb 9.6 oz (69.7 kg)   SpO2 98%   BMI 19.20 kg/m    Cardiac: Bradycardia and regular rhythm. Normal S1/S2. No murmurs, rubs, or gallops appreciated.  No carotid bruits.  2+ radial pulse bilaterally. Lungs: Clear bilaterally to ascultation.  Abdomen: Normoactive bowel sounds. No tenderness to deep or light palpation. No rebound or guarding.  Neurologic: No focal deficits, no dysarthria, no facial asymmetry, equal and bilateral motor strength of both upper and lower extremities, gait normal for age. Psych: Pleasant and appropriate   Orthostatics:  Sitting pulse 45, O2 100, BP 173/86  laying pulse 44, O2 100,  BP  182/85 standing pulse 49, O2 98, BP 183/85  ASSESSMENT/PLAN:   Assessment & Plan Bradycardia Orthostatics were negative, EKG was unchanged with sinus bradycardia and possible LVH.  Ongoing for over a year, but increasing in frequency now symptomatic multiple times a day, with 1-2 falls a week.  Previous workup ruled out vitamin deficiencies, low suspicion for neurologic deficit or CVA. Mag, CBC, BMP ordered and cardiology referral placed Imbalance  Essential hypertension Restarted losartan  50 mg, patient discontinued because he thought he did not need his medication anymore Screening for lung cancer Referral put in Screening for colon cancer Referral sent into the GI     Fairy Amy, MD [facility]

## 2023-07-22 NOTE — Assessment & Plan Note (Signed)
 Restarted losartan  50 mg, patient discontinued because he thought he did not need his medication anymore

## 2023-07-22 NOTE — Patient Instructions (Addendum)
 Thank you for visiting the clinic today, it was good to see you! In today's visit we discussed:  Imbalance: Today we conducted an EKG to evaluate for your slow heart rate which was unchanged from previous EKG. We also conducted blood pressure readings which were only positive for elevated blood pressure. We will put in a referral for cardiology as you may be a candidate for a heart pacing procedure.  High blood pressure: There was a miscommunication regarding your medication, and we will restart your losartan  50 mg, please pick up at the pharmacy today and take daily.  Tobacco use: Great job cutting back on your smoking!  We will put in a referral for you to see Dr. Koval to get a lung test performed, and discuss options for quitting including lozenges, patches, and medications.  Health maintenance: I put in a referral for you to get your colonoscopy and chest CT performed to monitor for any cancers in your lungs or colon.  For any questions, please call the office at [phone] or send me a message in MyChart. Have a great day!  I put in a follow-up for your annual wellness visit in 3 months.  -Fairy Amy, MD  [facility] Family Medicine Resident, PGY-1

## 2023-07-22 NOTE — Assessment & Plan Note (Signed)
 Orthostatics were negative, EKG was unchanged with sinus bradycardia and possible LVH.  Ongoing for over a year, but increasing in frequency now symptomatic multiple times a day, with 1-2 falls a week.  Previous workup ruled out vitamin deficiencies, low suspicion for neurologic deficit or CVA. Mag, CBC, BMP ordered and cardiology referral placed

## 2023-07-23 ENCOUNTER — Ambulatory Visit: Payer: Self-pay

## 2023-07-23 LAB — CBC WITH DIFFERENTIAL/PLATELET
Basophils Absolute: 0.1 x10E3/uL (ref 0.0–0.2)
Basos: 1 %
EOS (ABSOLUTE): 0.1 x10E3/uL (ref 0.0–0.4)
Eos: 1 %
Hematocrit: 45.6 % (ref 37.5–51.0)
Hemoglobin: 15.1 g/dL (ref 13.0–17.7)
Immature Grans (Abs): 0 x10E3/uL (ref 0.0–0.1)
Immature Granulocytes: 0 %
Lymphocytes Absolute: 1.7 x10E3/uL (ref 0.7–3.1)
Lymphs: 27 %
MCH: 30.2 pg (ref 26.6–33.0)
MCHC: 33.1 g/dL (ref 31.5–35.7)
MCV: 91 fL (ref 79–97)
Monocytes Absolute: 0.5 x10E3/uL (ref 0.1–0.9)
Monocytes: 8 %
Neutrophils Absolute: 3.9 x10E3/uL (ref 1.4–7.0)
Neutrophils: 63 %
Platelets: 186 x10E3/uL (ref 150–450)
RBC: 5 x10E6/uL (ref 4.14–5.80)
RDW: 13.4 % (ref 11.6–15.4)
WBC: 6.2 x10E3/uL (ref 3.4–10.8)

## 2023-07-23 LAB — BASIC METABOLIC PANEL WITH GFR
BUN/Creatinine Ratio: 16 (ref 10–24)
BUN: 12 mg/dL (ref 8–27)
CO2: 21 mmol/L (ref 20–29)
Calcium: 10.5 mg/dL — ABNORMAL HIGH (ref 8.6–10.2)
Chloride: 102 mmol/L (ref 96–106)
Creatinine, Ser: 0.75 mg/dL — ABNORMAL LOW (ref 0.76–1.27)
Glucose: 100 mg/dL — ABNORMAL HIGH (ref 70–99)
Potassium: 4.3 mmol/L (ref 3.5–5.2)
Sodium: 141 mmol/L (ref 134–144)
eGFR: 96 mL/min/1.73 (ref >59)

## 2023-07-23 LAB — MAGNESIUM: Magnesium: 2.3 mg/dL (ref 1.6–2.3)

## 2023-07-23 NOTE — Addendum Note (Signed)
 Addended by: LORRANE PAC on: 07/23/2023 10:14 AM   Modules accepted: Orders

## 2023-07-28 ENCOUNTER — Telehealth: Payer: Self-pay

## 2023-07-28 NOTE — Telephone Encounter (Signed)
-----   Message from [facility] Reena S sent at 07/27/2023  4:29 PM EDT ----- Ok to schedule CT at any [facility].  Thanks! Margit

## 2023-07-28 NOTE — Telephone Encounter (Signed)
 Patient is scheduled for CT July 22nd @800 

## 2023-08-03 ENCOUNTER — Ambulatory Visit
Admission: RE | Admit: 2023-08-03 | Discharge: 2023-08-03 | Disposition: A | Discharge status: Home / Self Care | Source: Ambulatory Visit | Attending: Family Medicine

## 2023-08-03 DIAGNOSIS — Z122 Encounter for screening for malignant neoplasm of respiratory organs: Secondary | ICD-10-CM

## 2023-08-03 DIAGNOSIS — F17210 Nicotine dependence, cigarettes, uncomplicated: Secondary | ICD-10-CM | POA: Diagnosis not present

## 2023-08-04 ENCOUNTER — Ambulatory Visit

## 2023-08-19 ENCOUNTER — Encounter: Payer: Self-pay | Admitting: Pharmacist

## 2023-08-19 ENCOUNTER — Ambulatory Visit: Admitting: Pharmacist

## 2023-08-19 VITALS — BP 144/73 | HR 46 | Wt 153.0 lb

## 2023-08-19 DIAGNOSIS — F17210 Nicotine dependence, cigarettes, uncomplicated: Secondary | ICD-10-CM

## 2023-08-19 NOTE — Patient Instructions (Signed)
 It was nice to see you today! Keep up the good work with cutting back on cigarettes! Your goal is to decrease to smoking less than 5 cigarettes per day by your next visit.    Medication Changes: - Continue all other medication the same.   Tobacco Patient Instructions  DELAY: Tell yourself that you'll wait for the next craving. Do it every time! DEEP BREATHS: One reason smoking feels good is because you breathe in deeply to inhale. Take four slow, deep breaths and feel the relaxation without the hamful effects of cigarettes. DRINK WATER: Drink a glass of cool water. It will give your hands and mouth something to do and will help flush the nicotine out of your system faster. DIVERT: Do something else -- brush your teeth, take a walk, call a friend who can offer you support. Just moving onto something other than thinking about cigarettes will move you through the craving.   Frequently Asked Questions  What can I do when I get the urge to smoke? To get through the urge to smoke, try the following:  Review your reasons for quitting and think of all the benefits to your health, your finances, and your family.  Remind yourself that there is no such thing as just one cigarette -- or even one puff.  Ride out the desire to smoke. Use the 4 Os -- Delay, Deep Breaths, Drink Water and Divert to get you through. The craving will go away eventually. Do not fool yourself into thinking you can have just one cigarette.  Any tips on how to deal with stress? Stress is a natural part of life. The key is to deal with it without reaching for a cigarette. Taking deep breaths, counting backwards from 10 and asking yourself 1-how big a deal is this?"  Writing down your feelings, talking with a friend and doing things like positive self-talk and meditation are some other ways that people deal with daily stress.  What if I start smoking again? Slips happen. Most people try to quit smoking a few times before they are  successful. Don't beat yourself up if this happens to you! Ask yourself if this was a slip or a relapse. A slip is a one-time mistake that is quickly corrected. A relapse is going back to your old smoking habits.   If you slip, don't give up. Think of it as a learning experience. Ask yourself what went wrong and renew your commitment to staying away from smoking for good.  If you relapse, try not to get discouraged. Ask yourself the question "What caused me to start smoking?" Figure out what helped you and what didn't when you tried to quit. Knowing why you relapsed is useful information for your next attempt to quit.  Please bring all medications to your clinic visits.  Please arrive 10-15 minutes prior to your scheduled visit time.

## 2023-08-19 NOTE — Progress Notes (Signed)
 S:     Chief Complaint  Patient presents with   Medication Management    PFTs, Tobacco cessation, BP   73 y.o. male who presents for diabetes evaluation, education, and management. Patient arrives in good spirits and presents without any assistance.   Patient was referred and last seen by Primary Care Provider, Dr. Lorrane, on 07/22/23.  At last visit, patient reported interest in tobacco cessation. Never had PFT performed and greater than 50-pack-year history. Losartan  50 mg daily restarted. Patient denies any adverse effects upon restarting.  PMH is significant for abdominal aortic aneurysm.  Patient denies shortness of breath, difficulty keeping up when walking with peers, or needing to stop to breath when walking or walking up stairs.  Medication adherence reported good. Patient denies ever being diagnosed with asthma or COPD.  Age when started using tobacco on a daily basis: 73 years old. Number of cigarettes/day: 10-12.  Patient reports >30 minutes until first cigarette after waking.  Most recent quit attempt: Patient reports never. Reports used to smoke 1 ppd, but is now down to 0.5 ppd for past year. Longest time ever been tobacco free:18 years  Medications used in past cessation efforts include: Denies previous trial of OTC NRTs - reports interest in trying nicotine gum, unsure of trying patches.  Rates IMPORTANCE of quitting tobacco on 1-10 scale of 6-7. Rates CONFIDENCE of quitting tobacco on 1-10 scale of 6.  Patient reports no specific known triggers.   Motivation to quit: Patient reports I just know I don't need it  O: Review of Systems  All other systems reviewed and are negative.   Physical Exam Constitutional:      Appearance: Normal appearance.  Pulmonary:     Effort: Pulmonary effort is normal.  Neurological:     Mental Status: He is alert.  Psychiatric:        Mood and Affect: Mood normal.        Behavior: Behavior normal.        Thought  Content: Thought content normal.        Judgment: Judgment normal.     Vitals:   08/19/23 1009  BP: (!) 144/73  Pulse: (!) 46  SpO2: 99%    mMRC score= <2  See Documentation Flowsheet - CAT/COPD for complete symptom scoring.  See scanned report or Documentation Flowsheet (discrete results - PFTs) for Spirometry results. Patient provided good effort while attempting spirometry.   Lung Age = 73 years old  A/P: Patient has never had PFT performed, but has greater than 50 -pack-year smoking history. Spirometry evaluation reveals mild obstruction with >100% FVC. Nebulized albuterol tx not completed today due to normal breathing, no reevaluation necessary. Spirometry [facility] based on mMRC score and no exacerbations in the last year.   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 Due to mild obstruction with tobacco use disorder, tobacco cessation is a priority over initiating pharmacotherapy at this time. -Plan to reduce cigarette intake from 10-12 per day to <5 per day by next visit. Reevaluate use at next visit. -Discussed potential need for inhaler in the future, but not necessary at this time. -Reviewed results of pulmonary function tests.  Pt verbalized understanding of results and education.    Written patient instructions provided.   Total time in face to face counseling 33 minutes.    Follow-up:  Pharmacist 09/29/23 PCP  clinic visit [facility] 10/01/23  Patient seen with Fonda Blase, PharmD Candidate - PY3 student and Calton Nash, PharmD Candidate - PY4 student.

## 2023-08-19 NOTE — Assessment & Plan Note (Signed)
 Patient has never had PFT performed, but has greater than 50 -pack-year smoking history. Spirometry evaluation reveals mild obstruction with >100% FVC. Nebulized albuterol tx not completed today due to normal breathing, no reevaluation necessary. Spirometry [facility] based on mMRC score and no exacerbations in the last year.   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 Due to mild obstruction with tobacco use disorder, tobacco cessation is a priority over initiating pharmacotherapy at this time. -Plan to reduce cigarette intake from 10-12 per day to <5 per day by next visit. Reevaluate use at next visit. -Discussed potential need for inhaler in the future, but not necessary at this time. -Reviewed results of pulmonary function tests.  Pt verbalized understanding of results and education.

## 2023-08-20 NOTE — Progress Notes (Signed)
 Reviewed and agree with Dr Macky Lower plan.

## 2023-09-29 ENCOUNTER — Ambulatory Visit: Admitting: Pharmacist

## 2023-10-01 ENCOUNTER — Ambulatory Visit (INDEPENDENT_AMBULATORY_CARE_PROVIDER_SITE_OTHER)

## 2023-10-01 VITALS — Ht 75.0 in | Wt 153.0 lb

## 2023-10-01 DIAGNOSIS — Z0000 Encounter for general adult medical examination without abnormal findings: Secondary | ICD-10-CM | POA: Diagnosis not present

## 2023-10-01 DIAGNOSIS — Z1211 Encounter for screening for malignant neoplasm of colon: Secondary | ICD-10-CM

## 2023-10-01 NOTE — Patient Instructions (Signed)
 Duane Delgado,  Thank you for taking the time for your Medicare Wellness Visit. I appreciate your continued commitment to your health goals. Please review the care plan we discussed, and feel free to reach out if I can assist you further.  Medicare recommends these wellness visits once per year to help you and your care team stay ahead of potential health issues. These visits are designed to focus on prevention, allowing your provider to concentrate on managing your acute and chronic conditions during your regular appointments.  Please note that Annual Wellness Visits do not include a physical exam. Some assessments may be limited, especially if the visit was conducted virtually. If needed, we may recommend a separate in-person follow-up with your provider.  Ongoing Care Seeing your primary care provider every 3 to 6 months helps us  monitor your health and provide consistent, personalized care.   Referrals If a referral was made during today's visit and you haven't received any updates within two weeks, please contact the referred provider directly to check on the status.  Recommended Screenings:  Health Maintenance  Topic Date Due   COVID-19 Vaccine (1) Never done   DTaP/Tdap/Td vaccine (1 - Tdap) Never done   Pneumococcal Vaccine for age over 52 (1 of 2 - PCV) Never done   Zoster (Shingles) Vaccine (1 of 2) Never done   Colon Cancer Screening  06/27/2021   Flu Shot  Never done   Screening for Lung Cancer  08/02/2024   Medicare Annual Wellness Visit  09/30/2024   Hepatitis C Screening  Completed   HPV Vaccine  Aged Out   Meningitis B Vaccine  Aged Out       10/01/2023   12:29 PM  Advanced Directives  Does Patient Have a Medical Advance Directive? No  Would patient like information on creating a medical advance directive? No - Patient declined   Advance Care Planning is important because it: Ensures you receive medical care that aligns with your values, goals, and  preferences. Provides guidance to your family and loved ones, reducing the emotional burden of decision-making during critical moments.  Vision: Annual vision screenings are recommended for early detection of glaucoma, cataracts, and diabetic retinopathy. These exams can also reveal signs of chronic conditions such as diabetes and high blood pressure.  Dental: Annual dental screenings help detect early signs of oral cancer, gum disease, and other conditions linked to overall health, including heart disease and diabetes.  Please see the attached documents for additional preventive care recommendations.   Healthy Eating, Adult Healthy eating may help you get and keep a healthy body weight, reduce the risk of chronic disease, and live a long and productive life. It is important to follow a healthy eating pattern. Your nutritional and calorie needs should be met mainly by different nutrient-rich foods. What are tips for following this plan? Reading food labels Read labels and choose the following: Reduced or low sodium products. Juices with 100% fruit juice. Foods with low saturated fats (<3 g per serving) and high polyunsaturated and monounsaturated fats. Foods with whole grains, such as whole wheat, cracked wheat, brown rice, and wild rice. Whole grains that are fortified with folic acid . This is recommended for females who are pregnant or who want to become pregnant. Read labels and do not eat or drink the following: Foods or drinks with added sugars. These include foods that contain brown sugar, corn sweetener, corn syrup, dextrose , fructose, glucose, high-fructose corn syrup, honey, invert sugar, lactose, malt syrup, maltose, molasses,  raw sugar, sucrose, trehalose, or turbinado sugar. Limit your intake of added sugars to less than 10% of your total daily calories. Do not eat more than the following amounts of added sugar per day: 6 teaspoons (25 g) for females. 9 teaspoons (38 g) for  males. Foods that contain processed or refined starches and grains. Refined grain products, such as white flour, degermed cornmeal, white bread, and white rice. Shopping Choose nutrient-rich snacks, such as vegetables, whole fruits, and nuts. Avoid high-calorie and high-sugar snacks, such as potato chips, fruit snacks, and candy. Use oil-based dressings and spreads on foods instead of solid fats such as butter, margarine, sour cream, or cream cheese. Limit pre-made sauces, mixes, and instant products such as flavored rice, instant noodles, and ready-made pasta. Try more plant-protein sources, such as tofu, tempeh, black beans, edamame, lentils, nuts, and seeds. Explore eating plans such as the Mediterranean diet or vegetarian diet. Try heart-healthy dips made with beans and healthy fats like hummus and guacamole. Vegetables go great with these. Cooking Use oil to saut or stir-fry foods instead of solid fats such as butter, margarine, or lard. Try baking, boiling, grilling, or broiling instead of frying. Remove the fatty part of meats before cooking. Steam vegetables in water or broth. Meal planning  At meals, imagine dividing your plate into fourths: One-half of your plate is fruits and vegetables. One-fourth of your plate is whole grains. One-fourth of your plate is protein, especially lean meats, poultry, eggs, tofu, beans, or nuts. Include low-fat dairy as part of your daily diet. Lifestyle Choose healthy options in all settings, including home, work, school, restaurants, or stores. Prepare your food safely: Wash your hands after handling raw meats. Where you prepare food, keep surfaces clean by regularly washing with hot, soapy water. Keep raw meats separate from ready-to-eat foods, such as fruits and vegetables. Cook seafood, meat, poultry, and eggs to the recommended temperature. Get a food thermometer. Store foods at safe temperatures. In general: Keep cold foods at 98F  (4.4C) or below. Keep hot foods at 198F (60C) or above. Keep your freezer at [facility] (-17.8C) or below. Foods are not safe to eat if they have been between the temperatures of 40-198F (4.4-60C) for more than 2 hours. What foods should I eat? Fruits Aim to eat 1-2 cups of fresh, canned (in natural juice), or frozen fruits each day. One cup of fruit equals 1 small apple, 1 large banana, 8 large strawberries, 1 cup (237 g) canned fruit,  cup (82 g) dried fruit, or 1 cup (240 mL) 100% juice. Vegetables Aim to eat 2-4 cups of fresh and frozen vegetables each day, including different varieties and colors. One cup of vegetables equals 1 cup (91 g) broccoli or cauliflower florets, 2 medium carrots, 2 cups (150 g) raw, leafy greens, 1 large tomato, 1 large bell pepper, 1 large sweet potato, or 1 medium white potato. Grains Aim to eat 5-10 ounce-equivalents of whole grains each day. Examples of 1 ounce-equivalent of grains include 1 slice of bread, 1 cup (40 g) ready-to-eat cereal, 3 cups (24 g) popcorn, or  cup (93 g) cooked rice. Meats and other proteins Try to eat 5-7 ounce-equivalents of protein each day. Examples of 1 ounce-equivalent of protein include 1 egg,  oz nuts (12 almonds, 24 pistachios, or 7 walnut halves), 1/4 cup (90 g) cooked beans, 6 tablespoons (90 g) hummus or 1 tablespoon (16 g) peanut butter. A cut of meat or fish that is the size of a deck  of cards is about 3-4 ounce-equivalents (85 g). Of the protein you eat each week, try to have at least 8 sounce (227 g) of seafood. This is about 2 servings per week. This includes salmon, trout, herring, sardines, and anchovies. Dairy Aim to eat 3 cup-equivalents of fat-free or low-fat dairy each day. Examples of 1 cup-equivalent of dairy include 1 cup (240 mL) milk, 8 ounces (250 g) yogurt, 1 ounces (44 g) natural cheese, or 1 cup (240 mL) fortified soy milk. Fats and oils Aim for about 5 teaspoons (21 g) of fats and oils per day. Choose  monounsaturated fats, such as canola and olive oils, mayonnaise made with olive oil or avocado oil, avocados, peanut butter, and most nuts, or polyunsaturated fats, such as sunflower, corn, and soybean oils, walnuts, pine nuts, sesame seeds, sunflower seeds, and flaxseed. Beverages Aim for 6 eight-ounce glasses of water per day. Limit coffee to 3-5 eight-ounce cups per day. Limit caffeinated beverages that have added calories, such as soda and energy drinks. If you drink alcohol: Limit how much you have to: 0-1 drink a day if you are male. 0-2 drinks a day if you are male. Know how much alcohol is in your drink. In the [place] one drink is one 12 oz bottle of beer (355 mL), one 5 oz glass of wine (148 mL), or one 1 oz glass of hard liquor (44 mL). Seasoning and other foods Try not to add too much salt to your food. Try using herbs and spices instead of salt. Try not to add sugar to food. This information is based on [place] nutrition guidelines. To learn more, visit [employer]. Exact amounts may vary. You may need different amounts. This information is not intended to replace advice given to you by your health care provider. Make sure you discuss any questions you have with your health care provider. Document Revised: 09/30/2021 Document Reviewed: 09/30/2021 Elsevier Patient Education  2024 [employer].

## 2023-10-01 NOTE — Progress Notes (Signed)
 Subjective:   Duane Delgado is a 73 y.o. male who presents for Medicare Annual/Subsequent preventive examination.  Visit Complete: Virtual I connected with  Duane Delgado on 10/01/23 by a audio enabled telemedicine application and verified that I am speaking with the correct person using two identifiers.  Patient Location: Home  Provider Location: Home Office  I discussed the limitations of evaluation and management by telemedicine. The patient expressed understanding and agreed to proceed.  Vital Signs: Because this visit was a virtual/telehealth visit, some criteria may be missing or patient reported. Any vitals not documented were not able to be obtained and vitals that have been documented are patient reported.   Cardiac Risk Factors include: advanced age (>55men, >24 women);hypertension;male gender     Objective:    Today's Vitals   10/01/23 1225  Weight: 153 lb (69.4 kg)  Height: 6' 3 (1.905 m)   Body mass index is 19.12 kg/m.     10/01/2023   12:29 PM 07/22/2023    9:40 AM 09/26/2022   11:23 AM 07/11/2022    9:17 AM 05/21/2022    9:44 AM 05/16/2020   10:16 AM 03/23/2018    8:38 AM  Advanced Directives  Does Patient Have a Medical Advance Directive? No No No No No No No   Would patient like information on creating a medical advance directive? No - Patient declined No - Patient declined Yes (MAU/Ambulatory/Procedural Areas - Information given)  No - Patient declined Yes (MAU/Ambulatory/Procedural Areas - Information given) No - Patient declined      Data saved with a previous flowsheet row definition    Current Medications (verified) Outpatient Encounter Medications as of 10/01/2023  Medication Sig   COSOPT 22.3-6.8 MG/ML ophthalmic solution 1 drop 2 (two) times daily.   ketorolac (ACULAR) 0.5 % ophthalmic solution SMARTSIG:In Eye(s)   latanoprost  (XALATAN ) 0.005 % ophthalmic solution Place 1 drop into both eyes at bedtime.    losartan  (COZAAR ) 50 MG tablet  Take 1 tablet (50 mg total) by mouth daily.   LUMIGAN 0.01 % SOLN SMARTSIG:In Eye(s) (Patient not taking: Reported on 10/01/2023)   No facility-administered encounter medications on file as of 10/01/2023.    Allergies (verified) Patient has no known allergies.   History: Past Medical History:  Diagnosis Date   Glaucoma    Hypertension    Past Surgical History:  Procedure Laterality Date   ABDOMINAL AORTIC ENDOVASCULAR STENT GRAFT N/A 09/01/2013   Procedure: ABDOMINAL AORTIC ENDOVASCULAR STENT GRAFT;  Surgeon: Gaile LELON New, MD;  Location: [facility] OR;  Service: Vascular;  Laterality: N/A;   Family History  Problem Relation Age of Onset   Cancer Sister    Social History   Socioeconomic History   Marital status: Married    Spouse name: Garen   Number of children: 2   Years of education: 12   Highest education level: Not on file  Occupational History   Not on file  Tobacco Use   Smoking status: Every Day    Current packs/day: 0.50    Types: Cigarettes    Passive exposure: Never   Smokeless tobacco: Never   Tobacco comments:    Used to smoke 1 ppd, now down to 0.5 ppd.     Denies trial of OTC NRTs - may be interested in trying nicotine gum, unsure of patches.    >30 minutes until first cigarette after waking  Vaping Use   Vaping status: Never Used  Substance and Sexual Activity   Alcohol  use: Yes    Alcohol/week: 0.0 standard drinks of alcohol    Comment: drinks 3-4 drinks of liquor per day   Drug use: No   Sexual activity: Yes  Other Topics Concern   Not on file  Social History Narrative   Patient lives in [place] with his wife.    Patient has 2 sons and 6 grandchildren, they live locally.    Patient is still working for a tree company 4x a week.   Patients exercise comes outdoor work.   Patient enjoys fishing, playing horseshoes, and seeing family and friends.    Social Drivers of Corporate investment banker Strain: Low Risk  (10/01/2023)   Overall  Financial Resource Strain (CARDIA)    Difficulty of Paying Living Expenses: Not hard at all  Food Insecurity: No Food Insecurity (10/01/2023)   Hunger Vital Sign    Worried About Running Out of Food in the Last Year: Never true    Ran Out of Food in the Last Year: Never true  Transportation Needs: Unknown (10/01/2023)   PRAPARE - Administrator, Civil Service (Medical): No    Lack of Transportation (Non-Medical): Not on file  Physical Activity: Sufficiently Active (10/01/2023)   Exercise Vital Sign    Days of Exercise per Week: 5 days    Minutes of Exercise per Session: 60 min  Stress: No Stress Concern Present (10/01/2023)   [employer] of Occupational Health - Occupational Stress Questionnaire    Feeling of Stress: Not at all  Social Connections: Moderately Integrated (10/01/2023)   Social Connection and Isolation Panel    Frequency of Communication with Friends and Family: More than three times a week    Frequency of Social Gatherings with Friends and Family: Once a week    Attends Religious Services: More than 4 times per year    Active Member of [employer] or Organizations: No    Attends Banker Meetings: Never    Marital Status: Married    Tobacco Counseling Ready to quit: Not Answered Counseling given: Not Answered Tobacco comments: Used to smoke 1 ppd, now down to 0.5 ppd.  Denies trial of OTC NRTs - may be interested in trying nicotine gum, unsure of patches. >30 minutes until first cigarette after waking   Clinical Intake:  Pre-visit preparation completed: Yes  Pain : No/denies pain     BMI - recorded: 19.12 Nutritional Status: BMI of 19-24  Normal Nutritional Risks: None Diabetes: No  How often do you need to have someone help you when you read instructions, pamphlets, or other written materials from your doctor or pharmacy?: 2 - Rarely (only fine print) What is the last grade level you completed in school?: 11th  Interpreter Needed?:  No  Information entered by :: Arnette Hoots, CMA   Activities of Daily Living    10/01/2023   12:27 PM  In your present state of health, do you have any difficulty performing the following activities:  Hearing? 0  Vision? 0  Difficulty concentrating or making decisions? 0  Walking or climbing stairs? 0  Dressing or bathing? 0  Doing errands, shopping? 0  Preparing Food and eating ? N  Using the Toilet? N  In the past six months, have you accidently leaked urine? N  Do you have problems with loss of bowel control? N  Managing your Medications? N  Managing your Finances? N  Housekeeping or managing your Housekeeping? N    Patient Care Team: Rosendo Rush,  MD as PCP - General (Family Medicine) Cyrus Carwin, MD as Consulting Physician (Ophthalmology) Serene Gaile ORN, MD as Consulting Physician (Vascular Surgery)  Indicate any recent Medical Services you may have received from other than [facility] providers in the past year (date may be approximate).     Assessment:   This is a routine wellness examination for Perrin.  Hearing/Vision screen Hearing Screening - Comments:: No issues Vision Screening - Comments:: No issues   Goals Addressed             This Visit's Progress    Patient Stated       Trying cut back on smoking.       Depression Screen    10/01/2023   12:31 PM 07/22/2023    9:40 AM 07/22/2023    9:37 AM 09/26/2022   11:22 AM 07/11/2022    9:17 AM 05/21/2022    9:46 AM 05/22/2020   11:27 AM  PHQ 2/9 Scores  PHQ - 2 Score 0   0 0 3 0  PHQ- 9 Score 2    1 5  0  Exception Documentation  Patient refusal Patient refusal        Fall Risk    10/01/2023   12:30 PM 07/22/2023    9:37 AM 09/26/2022   11:24 AM 07/11/2022    9:17 AM 05/21/2022    9:44 AM  Fall Risk   Falls in the past year? 0 0 0 0 0  Number falls in past yr: 0 0 0    Injury with Fall? 0 0 0 0   Risk for fall due to : No Fall Risks  No Fall Risks    Follow up Falls evaluation completed;Education  provided  Falls prevention discussed;Education provided;Falls evaluation completed      MEDICARE RISK AT HOME: Medicare Risk at Home Any stairs in or around the home?: No If so, are there any without handrails?: No Home free of loose throw rugs in walkways, pet beds, electrical cords, etc?: No Adequate lighting in your home to reduce risk of falls?: Yes Life alert?: No Use of a cane, walker or w/c?: No Grab bars in the bathroom?: No Shower chair or bench in shower?: No Elevated toilet seat or a handicapped toilet?: No  TIMED UP AND GO:  Was the test performed?  No    Cognitive Function:        10/01/2023   12:35 PM 09/26/2022   11:24 AM 05/16/2020   10:20 AM  6CIT Screen  What Year? 0 points 0 points 0 points  What month? 0 points 0 points 0 points  What time? 0 points 0 points 0 points  Count back from 20 0 points 0 points 0 points  Months in reverse 4 points 0 points 0 points  Repeat phrase 4 points 0 points 0 points  Total Score 8 points 0 points 0 points    Immunizations  There is no immunization history on file for this patient.  TDAP status: Due, Education has been provided regarding the importance of this vaccine. Advised may receive this vaccine at local pharmacy or Health Dept. Aware to provide a copy of the vaccination record if obtained from local pharmacy or Health Dept. Verbalized acceptance and understanding.  Flu Vaccine status: Due, Education has been provided regarding the importance of this vaccine. Advised may receive this vaccine at local pharmacy or Health Dept. Aware to provide a copy of the vaccination record if obtained from local pharmacy or Health Dept.  Verbalized acceptance and understanding.  Pneumococcal vaccine status: Due, Education has been provided regarding the importance of this vaccine. Advised may receive this vaccine at local pharmacy or Health Dept. Aware to provide a copy of the vaccination record if obtained from local pharmacy or  Health Dept. Verbalized acceptance and understanding.  Covid-19 vaccine status: Information provided on how to obtain vaccines.   Qualifies for Shingles Vaccine? Yes   Zostavax completed No   Shingrix Completed?: No.    Education has been provided regarding the importance of this vaccine. Patient has been advised to call insurance company to determine out of pocket expense if they have not yet received this vaccine. Advised may also receive vaccine at local pharmacy or Health Dept. Verbalized acceptance and understanding.  Screening Tests Health Maintenance  Topic Date Due   COVID-19 Vaccine (1) Never done   DTaP/Tdap/Td (1 - Tdap) Never done   Pneumococcal Vaccine: 50+ Years (1 of 2 - PCV) Never done   Zoster Vaccines- Shingrix (1 of 2) Never done   Colonoscopy  06/27/2021   Influenza Vaccine  Never done   Medicare Annual Wellness (AWV)  09/26/2023   Lung Cancer Screening  08/02/2024   Hepatitis C Screening  Completed   HPV VACCINES  Aged Out   Meningococcal B Vaccine  Aged Out    Health Maintenance  Health Maintenance Due  Topic Date Due   COVID-19 Vaccine (1) Never done   DTaP/Tdap/Td (1 - Tdap) Never done   Pneumococcal Vaccine: 50+ Years (1 of 2 - PCV) Never done   Zoster Vaccines- Shingrix (1 of 2) Never done   Colonoscopy  06/27/2021   Influenza Vaccine  Never done   Medicare Annual Wellness (AWV)  09/26/2023    Colorectal cancer screening: Type of screening: Colonoscopy. Completed 06/27/2016. Repeat every 5 years  Lung Cancer Screening: (Low Dose CT Chest recommended if Age 14-80 years, 20 pack-year currently smoking OR have quit w/in 15years.) does qualify.   Lung Cancer Screening Referral: Completed 08/03/2023  Additional Screening:  Hepatitis C Screening: does qualify; Completed 03/10/2018  Vision Screening: Recommended annual ophthalmology exams for early detection of glaucoma and other disorders of the eye. Is the patient up to date with their annual eye  exam?  Yes  Who is the provider or what is the name of the office in which the patient attends annual eye exams? Dr Gaither Luck If pt is not established with a provider, would they like to be referred to a provider to establish care? No .   Dental Screening: Recommended annual dental exams for proper oral hygiene  Community Resource Referral / Chronic Care Management: CRR required this visit?  No   CCM required this visit?  No     Plan:     I have personally reviewed and noted the following in the patient's chart:   Medical and social history Use of alcohol, tobacco or illicit drugs  Current medications and supplements including opioid prescriptions. Patient is not currently taking opioid prescriptions. Functional ability and status Nutritional status Physical activity Advanced directives List of other physicians Hospitalizations, surgeries, and ER visits in previous 12 months Vitals Screenings to include cognitive, depression, and falls Referrals and appointments  In addition, I have reviewed and discussed with patient certain preventive protocols, quality metrics, and best practice recommendations. A written personalized care plan for preventive services as well as general preventive health recommendations were provided to patient.     Arnette LOISE Hoots, CMA   10/01/2023  After Visit Summary: (Mail) Due to this being a telephonic visit, the after visit summary with patients personalized plan was offered to patient via mail   Nurse Notes: Patient is due for colonoscopy and would like to be set up for one. He also has stated that he will work on completing the needed vaccines. He is working on cutting back on smoking.

## 2023-10-02 NOTE — Addendum Note (Signed)
 Addended by: DONZETTA QUANT E on: 10/02/2023 09:13 AM   Modules accepted: Orders

## 2023-10-08 DIAGNOSIS — H2513 Age-related nuclear cataract, bilateral: Secondary | ICD-10-CM | POA: Diagnosis not present

## 2023-10-08 DIAGNOSIS — H401133 Primary open-angle glaucoma, bilateral, severe stage: Secondary | ICD-10-CM | POA: Diagnosis not present

## 2023-10-08 DIAGNOSIS — H47233 Glaucomatous optic atrophy, bilateral: Secondary | ICD-10-CM | POA: Diagnosis not present

## 2023-10-08 DIAGNOSIS — H402232 Chronic angle-closure glaucoma, bilateral, moderate stage: Secondary | ICD-10-CM | POA: Diagnosis not present

## 2023-10-15 ENCOUNTER — Encounter: Payer: Self-pay | Admitting: Family Medicine

## 2023-10-18 ENCOUNTER — Other Ambulatory Visit: Payer: Self-pay

## 2023-10-18 DIAGNOSIS — I10 Essential (primary) hypertension: Secondary | ICD-10-CM

## 2023-10-26 ENCOUNTER — Encounter: Payer: Self-pay | Admitting: Pharmacist

## 2024-01-27 ENCOUNTER — Other Ambulatory Visit: Payer: Self-pay

## 2024-01-27 DIAGNOSIS — I10 Essential (primary) hypertension: Secondary | ICD-10-CM

## 2024-01-27 MED ORDER — LOSARTAN POTASSIUM 50 MG PO TABS: 50.0000 mg | ORAL_TABLET | Freq: Every day | ORAL | 0 refills | Status: AC
